# Patient Record
Sex: Male | Born: 1985 | Race: White | Hispanic: No | Marital: Married | Smoking: Never smoker
Health system: Southern US, Community
[De-identification: ages and names within clinical notes are randomized; demographics above are authoritative.]

---

## 2006-12-01 ENCOUNTER — Emergency Department: Payer: Self-pay | Admitting: Emergency Medicine

## 2007-07-08 ENCOUNTER — Emergency Department: Payer: Self-pay | Admitting: Unknown Physician Specialty

## 2009-01-17 ENCOUNTER — Emergency Department (HOSPITAL_COMMUNITY): Admission: EM | Admit: 2009-01-17 | Discharge: 2009-01-18 | Payer: Self-pay | Admitting: Emergency Medicine

## 2009-02-04 ENCOUNTER — Ambulatory Visit (HOSPITAL_BASED_OUTPATIENT_CLINIC_OR_DEPARTMENT_OTHER): Admission: RE | Admit: 2009-02-04 | Discharge: 2009-02-04 | Payer: Self-pay | Admitting: Orthopedic Surgery

## 2010-10-17 LAB — POCT HEMOGLOBIN-HEMACUE
Hemoglobin: 13.6 g/dL (ref 13.0–17.0)
Hemoglobin: 17 g/dL (ref 13.0–17.0)

## 2010-11-23 NOTE — Consult Note (Signed)
Cole Lopez, Cole Lopez            ACCOUNT NO.:  [id number]   MEDICAL RECORD NO.:  [id number]          PATIENT TYPE:  EMS   LOCATION:  ED                           FACILITY:  [facility]   PHYSICIAN:  Artist Pais. Weingold, M.D.DATE OF BIRTH:  [birth year]   DATE OF CONSULTATION:  01/17/2009  DATE OF DISCHARGE:  01/18/2009                                 CONSULTATION   REQUESTING PHYSICIAN:  Bethann Berkshire, MD   REASON FOR CONSULTATION:  Cole Lopez is a 25 year old right hand  dominant male, works at __________when unfortunately he suffered an  injury to his left hand involving the small and ring fingers.  He is 25  years old.   ALLERGIES:  No known drug allergies.   MEDICATIONS:  Non current medications.   PAST HISTORY:  No recent hospitalizations or surgery.   FAMILY HISTORY:  Noncontributory.   SOCIAL HISTORY:  Noncontributory.   PHYSICAL EXAMINATION:  GENERAL:  A well-developed, well-nourished male,  pleasant, alert and oriented x3.  EXTREMITIES:  Examination of his hand, he has a small laceration over  the small finger volarly over the distal interphalangeal joint, full  tendon function, neurosensory is intact.  Examination of his ring finger  reveals a soft tissue loss with exposed distal phalangeal bone volarly  and oblique angle, tendon function is intact.  The nail plate and nail  bed are intact, however, again there is oblique soft tissue loss with  exposed distal phalangeal bone.   X-rays are pending.   IMPRESSION:  A 26 year old male with what appears to be injuries to the  ring and small finger on his nondominant left hand status post accident  at __________.  He clearly has exposed distal phalangeal bone  nondominant left ring finger which will require 1 of 3 options.  We  discussed in great detail full thickness skin grafting which will leave  him insensate palmarly which is unsatisfactory.  He could also undergo a  skeletal shortening of the DIP level, however,  his nail plate is intact  and his tendon function is intact therefore this is also not a great  option.  The most reasonable option in a young healthy person otherwise  would be a cross finger flap from the long finger to the ring finger  which is going to give him some neurosensory recovery.  At this point in  time we  had a thorough discussion.  He last ate a meal 3 hours ago, a full meal.  Therefore, we will go ahead and soak him in Betadine, close his wound on  the small finger and dress his ring finger.  He is to be given  antibiotics and call my office Monday morning.  We will set him up  electively for a left long to left ring finger cross finger flap.      Artist Pais Mina Marble, M.D.  Electronically Signed     MAW/MEDQ  D:  01/17/2009  T:  01/18/2009  Job:  [id number]

## 2010-11-23 NOTE — Op Note (Signed)
Cole Lopez, Cole Lopez            ACCOUNT NO.:  [id number]   MEDICAL RECORD NO.:  [id number]          PATIENT TYPE:  AMB   LOCATION:  DSC                          FACILITY:  [facility]   PHYSICIAN:  Artist Pais. 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   PROCEDURE:  Division of left long to left ring cross finger flap with  manipulation of left long and left ring finger proximal phalangeal  joint.   SURGEON:  Artist Pais. Mina Marble, MD   ASSISTANT:  None.   ANESTHESIA:  General.   TOURNIQUET:  None.   COMPLICATIONS:  None.   DRAINS:  None.   OPERATIVE REPORT:  The patient was taken to the operative suite.  After  induction of adequate general anesthesia, the left upper extremity was  prepped and draped in sterile fashion.  Once this was done, the cross  finger flap in the left long to left ring finger was divided.  The flap  was inset onto both the ring and long fingers using 4-0 Vicryl Rapide  suture.  Once this was done, gentle manipulation was undertaken of the  proximal phalangeal joint to the long ring finger until full motion was  obtained.  The wounds were then lightly irrigated and dressed with  Xeroform, 4 x 4s, and Coban wrap.  The patient tolerated the procedure  well and went to the recovery room in stable fashion.      Artist Pais Mina Marble, M.D.  Electronically Signed     MAW/MEDQ  D:  02/04/2009  T:  02/04/2009  Job:  [id number]

## 2019-08-13 ENCOUNTER — Other Ambulatory Visit: Payer: Self-pay

## 2019-08-14 ENCOUNTER — Ambulatory Visit: Payer: Commercial Managed Care - PPO | Attending: Internal Medicine

## 2019-08-14 DIAGNOSIS — Z20822 Contact with and (suspected) exposure to covid-19: Secondary | ICD-10-CM | POA: Insufficient documentation

## 2019-08-16 LAB — NOVEL CORONAVIRUS, NAA: SARS-CoV-2, NAA: NOT DETECTED

## 2021-02-24 ENCOUNTER — Ambulatory Visit: Payer: Self-pay | Admitting: Internal Medicine

## 2021-03-04 ENCOUNTER — Other Ambulatory Visit (HOSPITAL_BASED_OUTPATIENT_CLINIC_OR_DEPARTMENT_OTHER): Payer: Self-pay | Admitting: Gastroenterology

## 2021-03-04 ENCOUNTER — Other Ambulatory Visit: Payer: Self-pay | Admitting: Gastroenterology

## 2021-03-04 DIAGNOSIS — R748 Abnormal levels of other serum enzymes: Secondary | ICD-10-CM

## 2021-03-04 DIAGNOSIS — R1013 Epigastric pain: Secondary | ICD-10-CM

## 2021-03-04 DIAGNOSIS — R1010 Upper abdominal pain, unspecified: Secondary | ICD-10-CM

## 2021-03-05 ENCOUNTER — Ambulatory Visit
Admission: RE | Admit: 2021-03-05 | Discharge: 2021-03-05 | Disposition: A | Discharge status: Home / Self Care | Payer: Commercial Managed Care - PPO | Source: Ambulatory Visit | Attending: Gastroenterology | Admitting: Gastroenterology

## 2021-03-05 DIAGNOSIS — R1010 Upper abdominal pain, unspecified: Secondary | ICD-10-CM

## 2021-03-05 DIAGNOSIS — R748 Abnormal levels of other serum enzymes: Secondary | ICD-10-CM

## 2021-03-05 DIAGNOSIS — R1013 Epigastric pain: Secondary | ICD-10-CM

## 2021-03-08 ENCOUNTER — Ambulatory Visit
Admission: RE | Admit: 2021-03-08 | Discharge: 2021-03-08 | Disposition: A | Discharge status: Home / Self Care | Payer: Commercial Managed Care - PPO | Source: Ambulatory Visit | Attending: Gastroenterology | Admitting: Gastroenterology

## 2021-03-08 ENCOUNTER — Other Ambulatory Visit: Payer: Self-pay

## 2021-03-08 DIAGNOSIS — R748 Abnormal levels of other serum enzymes: Secondary | ICD-10-CM | POA: Diagnosis not present

## 2021-03-08 DIAGNOSIS — R1013 Epigastric pain: Secondary | ICD-10-CM | POA: Diagnosis present

## 2021-03-08 DIAGNOSIS — R1010 Upper abdominal pain, unspecified: Secondary | ICD-10-CM | POA: Diagnosis present

## 2021-04-06 ENCOUNTER — Other Ambulatory Visit: Payer: Self-pay

## 2021-04-06 ENCOUNTER — Ambulatory Visit: Payer: Commercial Managed Care - PPO | Admitting: Internal Medicine

## 2021-04-06 ENCOUNTER — Encounter: Payer: Self-pay | Admitting: Internal Medicine

## 2021-04-06 VITALS — BP 137/99 | HR 71 | Temp 98.6°F | Ht 68.82 in | Wt 237.6 lb

## 2021-04-06 DIAGNOSIS — F1010 Alcohol abuse, uncomplicated: Secondary | ICD-10-CM

## 2021-04-06 DIAGNOSIS — M549 Dorsalgia, unspecified: Secondary | ICD-10-CM | POA: Diagnosis not present

## 2021-04-06 DIAGNOSIS — R030 Elevated blood-pressure reading, without diagnosis of hypertension: Secondary | ICD-10-CM | POA: Diagnosis not present

## 2021-04-06 LAB — URINALYSIS, ROUTINE W REFLEX MICROSCOPIC
Bilirubin, UA: NEGATIVE
Glucose, UA: NEGATIVE
Ketones, UA: NEGATIVE
Leukocytes,UA: NEGATIVE
Nitrite, UA: NEGATIVE
Protein,UA: NEGATIVE
RBC, UA: NEGATIVE
Specific Gravity, UA: 1.025 (ref 1.005–1.030)
Urobilinogen, Ur: 0.2 mg/dL (ref 0.2–1.0)
pH, UA: 5 (ref 5.0–7.5)

## 2021-04-06 NOTE — Progress Notes (Signed)
BP (!) 137/99   Pulse 71   Temp 98.6 F (37 C) (Oral)   Ht 5' 8.82" (1.748 m)   Wt 237 lb 9.6 oz (107.8 kg)   SpO2 100%   BMI 35.27 kg/m    Subjective:    Patient ID: Cole Lopez, male    DOB: February 20, 1986, 35 y.o.   MRN: [id number]  Chief Complaint  Patient presents with   New Patient (Initial Visit)    HPI: Cole Lopez is a 35 y.o. male  Pt is here for establishing care. Symptoms started on the 17 th of sept had  Had a gall bladder infection per him. Per Gi notes from [facility], he was seen for N/ V x 2 months , had ETOH abuse elaborated belwo, was doung to have intermittent lef numbness, he was seen in ? UC for His symptoms and then referred to GI he was placed on Cipro and flagyl for the ablive emperically, his [place] abd showed possible helatpmaegaly , he was placed on protonix and is schedued for an EGD x 20th oct. He bw which showed an Elevated GERD ? UTI was noted with positive nitirites US abdomen shows prominent hepatomegaly   ETOH abuse used to drink a 5th of liquor x 2 yrs, has bene drinking beer since hew was 16 , a case or two - 24 cans in a week.  Works as an Dipl[employer] Services operational officer in [employer]  Patient presents with   New Patient (Initial Visit)    Relevant past medical, surgical, family and social history reviewed and updated as indicated. Interim medical history since our last visit reviewed. Allergies and medications reviewed and updated.  Review of Systems  Constitutional:  Negative for activity change, appetite change, chills, fatigue and fever.  HENT:  Negative for congestion, ear discharge, ear pain and facial swelling.   Eyes:  Negative for pain, discharge and itching.  Respiratory:  Negative for cough, chest tightness, shortness of breath and wheezing.   Cardiovascular:  Negative for chest pain, palpitations and leg swelling.  Gastrointestinal:  Negative for abdominal distention,  abdominal pain, blood in stool, constipation, diarrhea, nausea and vomiting.  Endocrine: Negative for cold intolerance, heat intolerance, polydipsia, polyphagia and polyuria.  Genitourinary:  Negative for difficulty urinating, dysuria, flank pain, frequency, hematuria and urgency.  Musculoskeletal:  Negative for arthralgias, gait problem, joint swelling and myalgias.  Skin:  Negative for color change, rash and wound.  Neurological:  Negative for dizziness, tremors, speech difficulty, weakness, light-headedness, numbness and headaches.  Hematological:  Does not bruise/bleed easily.  Psychiatric/Behavioral:  Negative for sleep disturbance and suicidal ideas.    Per HPI unless specifically indicated above     Objective:    BP (!) 137/99   Pulse 71   Temp 98.6 F (37 C) (Oral)   Ht 5' 8.82" (1.748 m)   Wt 237 lb 9.6 oz (107.8 kg)   SpO2 100%   BMI 35.27 kg/m   Wt Readings from Last 3 Encounters:  04/06/21 237 lb 9.6 oz (107.8 kg)    Physical Exam Vitals and nursing note reviewed.  Constitutional:      General: He is not in acute distress.    Appearance: Normal appearance. He is not ill-appearing or diaphoretic.  HENT:     Head: Normocephalic and atraumatic.     Right Ear: Tympanic membrane and external ear normal. There is no impacted cerumen.     Left  Ear: External ear normal.     Nose: No congestion or rhinorrhea.     Mouth/Throat:     Pharynx: No oropharyngeal exudate or posterior oropharyngeal erythema.  Eyes:     Conjunctiva/sclera: Conjunctivae normal.     Pupils: Pupils are equal, round, and reactive to light.  Cardiovascular:     Rate and Rhythm: Normal rate and regular rhythm.     Heart sounds: No murmur heard.   No friction rub. No gallop.  Pulmonary:     Effort: No respiratory distress.     Breath sounds: No stridor. No wheezing or rhonchi.  Chest:     Chest wall: No tenderness.  Abdominal:     General: Abdomen is flat. Bowel sounds are normal.      Palpations: Abdomen is soft. There is no mass.     Tenderness: There is no abdominal tenderness.  Musculoskeletal:        General: Normal range of motion.     Cervical back: Normal range of motion and neck supple. No rigidity or tenderness.     Left lower leg: No edema.  Skin:    General: Skin is warm and dry.  Neurological:     General: No focal deficit present.     Mental Status: He is alert.     Cranial Nerves: No cranial nerve deficit.     Sensory: No sensory deficit.     Motor: No weakness.     Coordination: Coordination normal.    Results for orders placed or performed in visit on 08/14/19  Novel Coronavirus, NAA ([facility])   Specimen: Nasopharyngeal(NP) swabs in vial transport medium   NASOPHARYNGE  TESTING  Result Value Ref Range   SARS-CoV-2, NAA Not Detected Not Detected       No current outpatient medications on file.  IMPRESSION: 1. Diffusely echogenic liver consistent with hepatic steatosis with probable fat sparing near the gallbladder fossa. 2. The spleen appears slightly enlarged.    Assessment & Plan:  Abdominal pain / N/ V : will refer to [facility] for help with alchol abuse.  ? Sec to ETOH abuse  To fu with GI for EGD as scheduled.  2. ETOH abuse : used to drink a 5th of liquor x 2 yrs, has been drinking beer since hew was 16 , a case or two - 24 cans in a week.   3. GERD : is on protonix for such GERD continue pantoprazole 40 mg q.day as prescribed patient advised to avoid laying down soon after his meals. He took a 2 hours between dinner and bedtime. Avoid spicy food and triggers that he knows food wise that worsen his acid reflux. Patient verbalized understanding of the above. Lifestyle modifications as above discussed with patient.  4. Prehypertension: Continue current meds.  Medication compliance emphasised. pt advised to keep Bp logs. Pt verbalised understanding of the same. Pt to have a low salt diet . Exercise to reach a goal of at least 150 mins a  week.  lifestyle modifications explained and pt understands importance of the above.    Problem List Items Addressed This Visit   None    Orders Placed This Encounter  Procedures   Lipid panel   CBC with Differential/Platelet   Comprehensive metabolic panel   Urinalysis, Routine w reflex microscopic   AMB Referral to Community Care Coordinaton   Ambulatory referral to Orthopedics     No orders of the defined types were placed in this encounter.    Follow  up plan: No follow-ups on file.

## 2021-04-07 DIAGNOSIS — R030 Elevated blood-pressure reading, without diagnosis of hypertension: Secondary | ICD-10-CM | POA: Insufficient documentation

## 2021-04-07 DIAGNOSIS — M549 Dorsalgia, unspecified: Secondary | ICD-10-CM | POA: Insufficient documentation

## 2021-04-07 DIAGNOSIS — F1010 Alcohol abuse, uncomplicated: Secondary | ICD-10-CM | POA: Insufficient documentation

## 2021-04-07 LAB — LIPID PANEL
Chol/HDL Ratio: 8.3 ratio — ABNORMAL HIGH (ref 0.0–5.0)
Cholesterol, Total: 250 mg/dL — ABNORMAL HIGH (ref 100–199)
HDL: 30 mg/dL — ABNORMAL LOW (ref >39)
LDL Chol Calc (NIH): 195 mg/dL — ABNORMAL HIGH (ref 0–99)
Triglycerides: 134 mg/dL (ref 0–149)
VLDL Cholesterol Cal: 25 mg/dL (ref 5–40)

## 2021-04-07 LAB — COMPREHENSIVE METABOLIC PANEL
ALT: 19 IU/L (ref 0–44)
AST: 19 IU/L (ref 0–40)
Albumin/Globulin Ratio: 1.2 (ref 1.2–2.2)
Albumin: 4.5 g/dL (ref 4.0–5.0)
Alkaline Phosphatase: 67 IU/L (ref 44–121)
BUN/Creatinine Ratio: 14 (ref 9–20)
BUN: 11 mg/dL (ref 6–20)
Bilirubin Total: 0.7 mg/dL (ref 0.0–1.2)
CO2: 22 mmol/L (ref 20–29)
Calcium: 10.1 mg/dL (ref 8.7–10.2)
Chloride: 101 mmol/L (ref 96–106)
Creatinine, Ser: 0.76 mg/dL (ref 0.76–1.27)
Globulin, Total: 3.8 g/dL (ref 1.5–4.5)
Glucose: 93 mg/dL (ref 70–99)
Potassium: 4.6 mmol/L (ref 3.5–5.2)
Sodium: 138 mmol/L (ref 134–144)
Total Protein: 8.3 g/dL (ref 6.0–8.5)
eGFR: 119 mL/min/{1.73_m2} (ref >59)

## 2021-04-07 LAB — CBC WITH DIFFERENTIAL/PLATELET
Basophils Absolute: 0.1 10*3/uL (ref 0.0–0.2)
Basos: 1 %
EOS (ABSOLUTE): 0.2 10*3/uL (ref 0.0–0.4)
Eos: 3 %
Hematocrit: 46.6 % (ref 37.5–51.0)
Hemoglobin: 15.6 g/dL (ref 13.0–17.7)
Immature Grans (Abs): 0 10*3/uL (ref 0.0–0.1)
Immature Granulocytes: 0 %
Lymphocytes Absolute: 2.5 10*3/uL (ref 0.7–3.1)
Lymphs: 26 %
MCH: 32.2 pg (ref 26.6–33.0)
MCHC: 33.5 g/dL (ref 31.5–35.7)
MCV: 96 fL (ref 79–97)
Monocytes Absolute: 0.5 10*3/uL (ref 0.1–0.9)
Monocytes: 5 %
Neutrophils Absolute: 6 10*3/uL (ref 1.4–7.0)
Neutrophils: 65 %
Platelets: 357 10*3/uL (ref 150–450)
RBC: 4.85 x10E6/uL (ref 4.14–5.80)
RDW: 12.1 % (ref 11.6–15.4)
WBC: 9.3 10*3/uL (ref 3.4–10.8)

## 2021-04-20 ENCOUNTER — Telehealth: Payer: Self-pay

## 2021-04-20 NOTE — Chronic Care Management (AMB) (Signed)
  Care Management   Note  04/20/2021 Name: Cole Lopez MRN: [id number] DOB: 06-26-1986  Cole Lopez is a 35 y.o. year old male who is a primary care patient of Vigg, Avanti, MD. I reached out to [employer] by phone today in response to a referral sent by Cole Lopez's primary care provider.   Cole Lopez was given information about care management services today including:  Care management services include personalized support from designated clinical staff supervised by his physician, including individualized plan of care and coordination with other care providers 24/7 contact phone numbers for assistance for urgent and routine care needs. The patient may stop care management services at any time by phone call to the office staff.  Patient did not agree to enrollment in care management services and does not wish to consider at this time.  Follow up plan: Patient declines engagement by the care management team. Appropriate care team members and provider have been notified via electronic communication.   Penne Lash, RMA Care Guide, Embedded Care Coordination [facility]  [place], [place] [address] Direct Dial: [phone] [email or web address]: [email or web address]

## 2021-05-07 ENCOUNTER — Ambulatory Visit: Payer: Commercial Managed Care - PPO | Admitting: Internal Medicine

## 2022-04-02 DIAGNOSIS — K760 Fatty (change of) liver, not elsewhere classified: Secondary | ICD-10-CM | POA: Insufficient documentation

## 2022-04-05 ENCOUNTER — Encounter: Payer: Self-pay | Admitting: Nurse Practitioner

## 2022-04-17 DIAGNOSIS — E7800 Pure hypercholesterolemia, unspecified: Secondary | ICD-10-CM | POA: Insufficient documentation

## 2022-04-17 NOTE — Patient Instructions (Signed)
Alcohol Use Disorder Alcohol use disorder is a condition in which drinking disrupts daily life. People with this condition drink too much alcohol and cannot control their drinking. Alcohol use disorder can cause serious problems with physical health. It can affect the brain, heart, and other internal organs. This disorder can raise the risk for certain cancers and cause problems with mental health, such as depression or anxiety. What are the causes? This condition is caused by drinking too much alcohol over time. Some people with this condition drink to cope with or escape from negative life events. Others drink to relieve symptoms of physical pain or symptoms of mental illness. What increases the risk? You are more likely to develop this condition if: You have a family history of alcohol use disorder. Your culture encourages drinking to the point of becoming drunk (intoxication). You had a mood or conduct disorder in childhood. You have been abused. You are an adolescent and you: Have poor performance in school. Have poor supervision or guidance. Act on impulse and like taking risks. What are the signs or symptoms? Symptoms of this condition include: Drinking more than you want to. Trying several times without success to drink less. Spending a lot of time thinking about alcohol, getting alcohol, drinking alcohol, or recovering from drinking alcohol. Continuing to drink even when it is causing serious problems in your daily life. Drinking when it is dangerous to drink, such as before driving a car. Needing more and more alcohol to get the same effect you want (building up tolerance). Having symptoms of withdrawal when you stop drinking. Withdrawal symptoms may include: Trouble sleeping, leading to tiredness (fatigue). Mood swings of depression and anxiety. Physical symptoms, such as a fast heart rate, rapid breathing, high blood pressure (hypertension), fever, cold sweats, or  nausea. Seizures. Severe confusion. Feeling or seeing things that are not there (hallucinations). Shaking movements that you cannot control (tremors). How is this diagnosed? This condition is diagnosed with an assessment. Your health care provider may start by asking three or four questions about your drinking, or they may give you a simple test to take. This helps to get clear information from you. You may also have a physical exam or lab tests. You may be referred to a substance abuse counselor. How is this treated? With education, some people with alcohol use disorder are able to reduce their drinking. Many with this disorder cannot change their drinking behavior on their own and need help with treatment from substance use specialists. Treatments may include: Detoxification. Detoxification involves quitting drinking with supervision and direction of health care providers. Your health care provider may prescribe medicines within the first week to help lessen withdrawal symptoms. Alcohol withdrawal can be dangerous and life-threatening. Detoxification may be provided in a home, community, or primary care setting, or in a hospital or substance use treatment facility. Counseling. This may involve motivational interviewing (MI), family therapy, or cognitive behavioral therapy (CBT). A counselor can address the things you can do to change your drinking behavior and how to maintain the changes. Talk therapy aims to: Identify your positive motivations to change. Identify and avoid the things that trigger your drinking. Help you learn how to plan your behavior change. Develop support systems that can help you sustain the change. Medicines. Medicines can help treat this disorder by: Decreasing cravings. Decreasing the positive feeling you have when you drink. Causing an uncomfortable physical reaction when you drink (aversion therapy). Support groups such as Alcoholics Anonymous (AA). These groups are    led by people who have quit drinking. The groups provide emotional support, advice, and guidance. Some people with this condition benefit from a combination of treatments provided by specialized substance use treatment centers. Follow these instructions at home:  Medicines Take over-the-counter and prescription medicines only as told by your health care provider. Ask before starting any new medicines, herbs, or supplements. General instructions Ask friends and family members to support your choice to stay sober. Avoid places where alcohol is served. Create a plan to deal with tempting situations. Attend support groups regularly. Practice hobbies or activities you enjoy. Do not drink and drive. How is this prevented? Do not drink alcohol if your health care provider tells you not to drink. If you drink alcohol: Limit how much you have to: 0-1 drink a day for women who are not pregnant. 0-2 drinks a day for men. Know how much alcohol is in your drink. In the [place] one drink equals one 12 oz bottle of beer (355 mL), one 5 oz glass of wine (148 mL), or one 1 oz glass of hard liquor (44 mL). If you have a mental health condition, seek treatment. Develop a healthy lifestyle through: Meditation or deep breathing. Exercise. Spending time in nature. Listening to music. Talking with a trusted friend or family member. If you are a teen: Do not drink alcohol. Avoid gatherings where you might be tempted to drink alcohol. Do not be afraid to say no if someone offers you alcohol. Speak up about why you do not want to drink. Set a positive example for others around you by not drinking. Build relationships with friends who do not drink. Where to find more information Substance Abuse and Mental Health Services Administration: samhsa.gov Alcoholics Anonymous: aa.org Contact a health care provider if: You cannot take your medicines as told. Your symptoms get worse or you experience symptoms of  withdrawal when you stop drinking. You start drinking again (relapse) and your symptoms get worse. Get help right away if: You have thoughts about hurting yourself or others. Get help right away if you feel like you may hurt yourself or others, or have thoughts about taking your own life. Go to your nearest emergency room or: Call 911. Call the National Suicide Prevention Lifeline at 1-800-273-8255 or 988. This is open 24 hours a day. Text the Crisis Text Line at 741741. Summary Alcohol use disorder is a condition in which drinking disrupts daily life. People with this condition drink too much alcohol and cannot control their drinking. Treatment may include detoxification, counseling, medicines, and support groups. Ask friends and family members to support you. Avoid situations where alcohol is served. Get help right away if you have thoughts about hurting yourself or others. This information is not intended to replace advice given to you by your health care provider. Make sure you discuss any questions you have with your health care provider. Document Revised: 09/01/2021 Document Reviewed: 09/01/2021 Elsevier Patient Education  2023 [employer] Inc.  

## 2022-04-20 ENCOUNTER — Ambulatory Visit (INDEPENDENT_AMBULATORY_CARE_PROVIDER_SITE_OTHER): Payer: 59 | Admitting: Nurse Practitioner

## 2022-04-20 ENCOUNTER — Encounter: Payer: Self-pay | Admitting: Internal Medicine

## 2022-04-20 ENCOUNTER — Encounter: Payer: Self-pay | Admitting: Nurse Practitioner

## 2022-04-20 VITALS — BP 136/74 | HR 82 | Temp 98.2°F | Resp 18 | Ht 69.0 in | Wt 257.6 lb

## 2022-04-20 DIAGNOSIS — R030 Elevated blood-pressure reading, without diagnosis of hypertension: Secondary | ICD-10-CM

## 2022-04-20 DIAGNOSIS — J309 Allergic rhinitis, unspecified: Secondary | ICD-10-CM | POA: Insufficient documentation

## 2022-04-20 DIAGNOSIS — L309 Dermatitis, unspecified: Secondary | ICD-10-CM | POA: Insufficient documentation

## 2022-04-20 DIAGNOSIS — Z0000 Encounter for general adult medical examination without abnormal findings: Secondary | ICD-10-CM

## 2022-04-20 DIAGNOSIS — K760 Fatty (change of) liver, not elsewhere classified: Secondary | ICD-10-CM | POA: Diagnosis not present

## 2022-04-20 DIAGNOSIS — Z114 Encounter for screening for human immunodeficiency virus [HIV]: Secondary | ICD-10-CM | POA: Diagnosis not present

## 2022-04-20 DIAGNOSIS — F1010 Alcohol abuse, uncomplicated: Secondary | ICD-10-CM

## 2022-04-20 DIAGNOSIS — Z1159 Encounter for screening for other viral diseases: Secondary | ICD-10-CM

## 2022-04-20 DIAGNOSIS — E7800 Pure hypercholesterolemia, unspecified: Secondary | ICD-10-CM

## 2022-04-20 DIAGNOSIS — J301 Allergic rhinitis due to pollen: Secondary | ICD-10-CM

## 2022-04-20 DIAGNOSIS — L2082 Flexural eczema: Secondary | ICD-10-CM

## 2022-04-20 MED ORDER — FLUTICASONE PROPIONATE 50 MCG/ACT NA SUSP: 2.0000 | Freq: Every day | NASAL | 6 refills | Status: DC

## 2022-04-20 MED ORDER — TRIAMCINOLONE ACETONIDE 0.1 % EX CREA: 1.0000 | TOPICAL_CREAM | Freq: Two times a day (BID) | CUTANEOUS | 5 refills | Status: DC

## 2022-04-20 NOTE — Assessment & Plan Note (Signed)
Noted on imaging 03/08/21 -- at this time recommend cut back on alcohol use and work towards complete cessation.  Focus on healthy diet changes and regular exercise, 30 minutes 5 days a week.  Check labs today.  Consider GI referral in future if and worsening LFTs.

## 2022-04-20 NOTE — Progress Notes (Signed)
BP 136/74 (BP Location: Left Arm, Patient Position: Sitting, Cuff Size: Normal)   Pulse 82   Temp 98.2 F (36.8 C) (Oral)   Resp 18   Ht 5' 9" (1.753 m)   Wt 257 lb 9.6 oz (116.8 kg)   SpO2 99%   BMI 38.04 kg/m    Subjective:    Patient ID: Cole Lopez, male    DOB: 02/11/1986, 36 y.o.   MRN: [id number]  HPI: Cole Lopez is a 36 y.o. male presenting on 04/20/2022 for comprehensive medical examination. Current medical complaints include: allergies and dry skin  He currently lives with: wife Interim Problems from his last visit:  as above  History of elevation in LFTs on past labs with imaging done noting hepatic steatosis and spleen slightly enlarged.  He endorses alcohol use -- currently between his wife and him drink about a 5th of brown liquor, this is a cut back from previous -- used to drink 1/2 gallon between his wife and him. [place] Office Visit from 04/20/2022 in [facility]  Alcohol Use Disorder Identification Test Final Score (AUDIT) 8       SKIN DRYNESS Present to elbow and knees -- has been present for awhile.  More to left elbow. Duration: months Location: as above History of trauma in area: no Pain: no Redness: no Swelling: no Oozing: no Pus: no Fevers: no Nausea/vomiting: no Status: fluctuating Treatments attempted: various lotions   ALLERGIES Has been struggling with allergies for a few months.  Taking Sudafed. Works in [place]. Duration: months Runny nose: yes "clear Nasal congestion: yes Nasal itching: yes Sneezing: yes Eye swelling, itching or discharge: no Post nasal drip: yes Cough: yes Sinus pressure: no  Ear pain: none Ear pressure: yes bilateral Fever: none Symptoms occur seasonally: yes Symptoms occur perenially: no Satisfied with current treatment: no Allergist evaluation in past: no Allergen injection immunotherapy: no Recurrent sinus infections:  no ENT evaluation in past: no Known environmental allergy: no Indoor pets: yes History of asthma: no Current allergy medications: Sudafed Treatments attempted: Sudafed, Xyzal   The ASCVD Risk score (Arnett DK, et al., 2019) failed to calculate for the following reasons:   The 2019 ASCVD risk score is only valid for ages 62 to 41  Functional Status Survey: Is the patient deaf or have difficulty hearing?: No Does the patient have difficulty seeing, even when wearing glasses/contacts?: No Does the patient have difficulty concentrating, remembering, or making decisions?: No Does the patient have difficulty walking or climbing stairs?: No Does the patient have difficulty dressing or bathing?: No Does the patient have difficulty doing errands alone such as visiting a doctor's office or shopping?: No  FALL RISK:    04/20/2022    2:28 PM 04/06/2021    9:04 AM  [place] in the past year? 0 0  Number falls in past yr: 0 0  Injury with Fall? 0 0  Risk for fall due to : No Fall Risks No Fall Risks  Follow up Falls evaluation completed Falls evaluation completed       04/20/2022    2:28 PM 04/06/2021    9:04 AM  Depression screen PHQ 2/9  Decreased Interest 0 0  Down, Depressed, Hopeless 0 0  PHQ - 2 Score 0 0  Altered sleeping 2 0  Tired, decreased energy 1 0  Change in appetite 1 0  Feeling bad or failure about yourself  0 0  Trouble concentrating 0 0  Moving slowly or fidgety/restless 0 0  Suicidal thoughts 0 0  PHQ-9 Score 4 0  Difficult doing work/chores Somewhat difficult Not difficult at all       04/20/2022    2:28 PM  GAD 7 : Generalized Anxiety Score  Nervous, Anxious, on Edge 0  Control/stop worrying 0  Worry too much - different things 0  Trouble relaxing 0  Restless 0  Easily annoyed or irritable 0  Afraid - awful might happen 0  Total GAD 7 Score 0  Anxiety Difficulty Not difficult at all    Advanced Directives <no information>  Past Medical  History:  History reviewed. No pertinent past medical history.  Surgical History:  History reviewed. No pertinent surgical history.  Medications:  No current outpatient medications on file prior to visit.   No current facility-administered medications on file prior to visit.    Allergies:  No Known Allergies  Social History:  Social History   Socioeconomic History   Marital status: Married    Spouse name: Not on file   Number of children: Not on file   Years of education: Not on file   Highest education level: Not on file  Occupational History   Not on file  Tobacco Use   Smoking status: Never   Smokeless tobacco: Never  Vaping Use   Vaping Use: Never used  Substance and Sexual Activity   Alcohol use: Not Currently   Drug use: Never   Sexual activity: Not Currently  Other Topics Concern   Not on file  Social History Narrative   Works for [employer]   Social Determinants of Health   Financial Resource Strain: Not on file  Food Insecurity: Not on file  Transportation Needs: Not on file  Physical Activity: Not on file  Stress: Not on file  Social Connections: Not on file  Intimate Partner Violence: Not on file   Social History   Tobacco Use  Smoking Status Never  Smokeless Tobacco Never   Social History   Substance and Sexual Activity  Alcohol Use Not Currently    Family History:  Family History  Problem Relation Age of Onset   Diabetes Mother    Diabetes Father    Heart disease Father     Past medical history, surgical history, medications, allergies, family history and social history reviewed with patient today and changes made to appropriate areas of the chart.   ROS All other ROS negative except what is listed above and in the HPI.      Objective:    BP 136/74 (BP Location: Left Arm, Patient Position: Sitting, Cuff Size: Normal)   Pulse 82   Temp 98.2 F (36.8 C) (Oral)   Resp 18   Ht 5' 9" (1.753 m)   Wt 257 lb 9.6 oz (116.8  kg)   SpO2 99%   BMI 38.04 kg/m   Wt Readings from Last 3 Encounters:  04/20/22 257 lb 9.6 oz (116.8 kg)  04/06/21 237 lb 9.6 oz (107.8 kg)    Physical Exam Vitals and nursing note reviewed.  Constitutional:      General: He is awake. He is not in acute distress.    Appearance: He is well-developed and well-groomed. He is obese. He is not ill-appearing or toxic-appearing.  HENT:     Head: Normocephalic and atraumatic.     Right Ear: Hearing, tympanic membrane, ear canal and external ear normal. No drainage.     Left   Ear: Hearing, tympanic membrane, ear canal and external ear normal. No drainage.     Nose: Rhinorrhea present. Rhinorrhea is clear.     Right Turbinates: Swollen.     Left Turbinates: Swollen.     Right Sinus: No maxillary sinus tenderness or frontal sinus tenderness.     Left Sinus: No maxillary sinus tenderness or frontal sinus tenderness.     Mouth/Throat:     Mouth: Mucous membranes are moist.     Pharynx: Posterior oropharyngeal erythema (mild with cobblestone pattern) present. No pharyngeal swelling or oropharyngeal exudate.     Tonsils: No tonsillar exudate. 2+ on the right. 2+ on the left.  Eyes:     General: Lids are normal.        Right eye: No discharge.        Left eye: No discharge.     Extraocular Movements: Extraocular movements intact.     Conjunctiva/sclera: Conjunctivae normal.     Pupils: Pupils are equal, round, and reactive to light.     Visual Fields: Right eye visual fields normal and left eye visual fields normal.  Neck:     Thyroid: No thyromegaly.     Vascular: No carotid bruit.  Cardiovascular:     Rate and Rhythm: Normal rate and regular rhythm.     Heart sounds: Normal heart sounds, S1 normal and S2 normal. No murmur heard.    No gallop.  Pulmonary:     Effort: Pulmonary effort is normal. No accessory muscle usage or respiratory distress.     Breath sounds: Normal breath sounds.  Abdominal:     General: Bowel sounds are normal.      Palpations: Abdomen is soft. There is no hepatomegaly or splenomegaly.     Tenderness: There is no abdominal tenderness.  Musculoskeletal:        General: Normal range of motion.     Cervical back: Normal range of motion and neck supple.     Right lower leg: No edema.     Left lower leg: No edema.  Lymphadenopathy:     Head:     Right side of head: No submental, submandibular, tonsillar, preauricular or posterior auricular adenopathy.     Left side of head: No submental, submandibular, tonsillar, preauricular or posterior auricular adenopathy.     Cervical: No cervical adenopathy.  Skin:    General: Skin is warm and dry.     Capillary Refill: Capillary refill takes less than 2 seconds.     Findings: Rash present. Rash is scaling.     Comments: Scaling type rash to left elbow and left knee.  Neurological:     Mental Status: He is alert and oriented to person, place, and time.     Gait: Gait is intact.     Deep Tendon Reflexes: Reflexes are normal and symmetric.     Reflex Scores:      Brachioradialis reflexes are 2+ on the right side and 2+ on the left side.      Patellar reflexes are 2+ on the right side and 2+ on the left side. Psychiatric:        Attention and Perception: Attention normal.        Mood and Affect: Mood normal.        Speech: Speech normal.        Behavior: Behavior normal. Behavior is cooperative.        Thought Content: Thought content normal.        Cognition and  Memory: Cognition normal.    Results for orders placed or performed in visit on 04/06/21  Lipid panel  Result Value Ref Range   Cholesterol, Total 250 (H) 100 - 199 mg/dL   Triglycerides 134 0 - 149 mg/dL   HDL 30 (L) >39 mg/dL   VLDL Cholesterol Cal 25 5 - 40 mg/dL   LDL Chol Calc (NIH) 195 (H) 0 - 99 mg/dL   Chol/HDL Ratio 8.3 (H) 0.0 - 5.0 ratio  CBC with Differential/Platelet  Result Value Ref Range   WBC 9.3 3.4 - 10.8 x10E3/uL   RBC 4.85 4.14 - 5.80 x10E6/uL   Hemoglobin 15.6 13.0 -  17.7 g/dL   Hematocrit 46.6 37.5 - 51.0 %   MCV 96 79 - 97 fL   MCH 32.2 26.6 - 33.0 pg   MCHC 33.5 31.5 - 35.7 g/dL   RDW 12.1 11.6 - 15.4 %   Platelets 357 150 - 450 x10E3/uL   Neutrophils 65 Not Estab. %   Lymphs 26 Not Estab. %   Monocytes 5 Not Estab. %   Eos 3 Not Estab. %   Basos 1 Not Estab. %   Neutrophils Absolute 6.0 1.4 - 7.0 x10E3/uL   Lymphocytes Absolute 2.5 0.7 - 3.1 x10E3/uL   Monocytes Absolute 0.5 0.1 - 0.9 x10E3/uL   EOS (ABSOLUTE) 0.2 0.0 - 0.4 x10E3/uL   Basophils Absolute 0.1 0.0 - 0.2 x10E3/uL   Immature Granulocytes 0 Not Estab. %   Immature Grans (Abs) 0.0 0.0 - 0.1 x10E3/uL  Comprehensive metabolic panel  Result Value Ref Range   Glucose 93 70 - 99 mg/dL   BUN 11 6 - 20 mg/dL   Creatinine, Ser 0.76 0.76 - 1.27 mg/dL   eGFR 119 >59 mL/min/1.73   BUN/Creatinine Ratio 14 9 - 20   Sodium 138 134 - 144 mmol/L   Potassium 4.6 3.5 - 5.2 mmol/L   Chloride 101 96 - 106 mmol/L   CO2 22 20 - 29 mmol/L   Calcium 10.1 8.7 - 10.2 mg/dL   Total Protein 8.3 6.0 - 8.5 g/dL   Albumin 4.5 4.0 - 5.0 g/dL   Globulin, Total 3.8 1.5 - 4.5 g/dL   Albumin/Globulin Ratio 1.2 1.2 - 2.2   Bilirubin Total 0.7 0.0 - 1.2 mg/dL   Alkaline Phosphatase 67 44 - 121 IU/L   AST 19 0 - 40 IU/L   ALT 19 0 - 44 IU/L  Urinalysis, Routine w reflex microscopic  Result Value Ref Range   Specific Gravity, UA 1.025 1.005 - 1.030   pH, UA 5.0 5.0 - 7.5   Color, UA Yellow Yellow   Appearance Ur Clear Clear   Leukocytes,UA Negative Negative   Protein,UA Negative Negative/Trace   Glucose, UA Negative Negative   Ketones, UA Negative Negative   RBC, UA Negative Negative   Bilirubin, UA Negative Negative   Urobilinogen, Ur 0.2 0.2 - 1.0 mg/dL   Nitrite, UA Negative Negative      Assessment & Plan:   Problem List Items Addressed This Visit       Respiratory   Allergic rhinitis    Chronic, ongoing.  Recommend stop Sudafed due to elevation in BP and risks.  Start Claritin or Allegra  daily + ordered Flonase nasal spray -- educated him on this and how to use.  Consider allergy testing or ENT in future if ongoing symptoms.        Digestive   Hepatic steatosis    Noted on imaging 03/08/21 --   at this time recommend cut back on alcohol use and work towards complete cessation.  Focus on healthy diet changes and regular exercise, 30 minutes 5 days a week.  Check labs today.  Consider GI referral in future if and worsening LFTs.      Relevant Orders   Comprehensive metabolic panel     Musculoskeletal and Integument   Eczema    To elbows and knees.  Will trial Triamcinolone cream to areas and see if benefit.  If no benefit change to Eucrisa.        Other   Alcohol abuse    Chronic, has been cutting back.  Continues to drink nightly with his wife.  Recommend continue to cut back and work towards complete cessation.  Not interested in medications or rehab.      Relevant Orders   Comprehensive metabolic panel   Elevated low density lipoprotein (LDL) cholesterol level    Noted on past labs with LDL 195.  Recheck today and will consider starting statin if LDL remains above 190 -- discussed with patient -- would need to monitor LFT closely if statin use initiated.      Relevant Orders   Comprehensive metabolic panel   Lipid Panel w/o Chol/HDL Ratio   Prehypertension - Primary    Chronic, ongoing -- suspect related to alcohol use.  ?some underlying OSA based on exam and reported snoring, does not want to have sleep study at this time.  Initial BP elevated today and repeat improved.  Recommend he stop Sudafed and start Claritin or Allegra for allergies + recommend cut back on alcohol use.  Labs today.      Relevant Orders   CBC with Differential/Platelet   TSH   Other Visit Diagnoses     Need for hepatitis C screening test       Hep C screen on labs today per guidelines for one time screening, discussed with patient.   Relevant Orders   Hepatitis C antibody    Encounter for screening for HIV       HIV screen on labs today per guidelines for one time screening, discussed with patient.   Relevant Orders   HIV Antibody (routine testing w rflx)   Encounter for annual physical exam       Annual physical today with labs and health maintenance reviewed, discussed with patient.       IMMUNIZATIONS:   - Tdap: Tetanus vaccination status reviewed: refuses. - Influenza: Refused - Pneumovax: Not applicable - Prevnar: Not applicable - Zostavax vaccine: Not applicable  SCREENING: - Colonoscopy: Not applicable  Discussed with patient purpose of the colonoscopy is to detect colon cancer at curable precancerous or early stages   - AAA Screening: Not applicable  -Hearing Test: Not applicable  -Spirometry: Not applicable   PATIENT COUNSELING:    Sexuality: Discussed sexually transmitted diseases, partner selection, use of condoms, avoidance of unintended pregnancy  and contraceptive alternatives.   Advised to avoid cigarette smoking.  I discussed with the patient that most people either abstain from alcohol or drink within safe limits (<=14/week and <=4 drinks/occasion for males, <=7/weeks and <= 3 drinks/occasion for females) and that the risk for alcohol disorders and other health effects rises proportionally with the number of drinks per week and how often a drinker exceeds daily limits.  Discussed cessation/primary prevention of drug use and availability of treatment for abuse.   Diet: Encouraged to adjust caloric intake to maintain  or achieve ideal body weight, to reduce   intake of dietary saturated fat and total fat, to limit sodium intake by avoiding high sodium foods and not adding table salt, and to maintain adequate dietary potassium and calcium preferably from fresh fruits, vegetables, and low-fat dairy products.    Stressed the importance of regular exercise  Injury prevention: Discussed safety belts, safety helmets, smoke detector, smoking  near bedding or upholstery.   Dental health: Discussed importance of regular tooth brushing, flossing, and dental visits.   Follow up plan: NEXT PREVENTATIVE PHYSICAL DUE IN 1 YEAR. Return in about 6 months (around 10/20/2022) for HTN AND LFT CHECK.  

## 2022-04-20 NOTE — Assessment & Plan Note (Signed)
To elbows and knees.  Will trial Triamcinolone cream to areas and see if benefit.  If no benefit change to [place].

## 2022-04-20 NOTE — Assessment & Plan Note (Signed)
Chronic, ongoing.  Recommend stop Sudafed due to elevation in BP and risks.  Start Claritin or Allegra daily + ordered Flonase nasal spray -- educated him on this and how to use.  Consider allergy testing or ENT in future if ongoing symptoms.

## 2022-04-20 NOTE — Assessment & Plan Note (Signed)
Chronic, has been cutting back.  Continues to drink nightly with his wife.  Recommend continue to cut back and work towards complete cessation.  Not interested in medications or rehab.

## 2022-04-20 NOTE — Assessment & Plan Note (Signed)
Chronic, ongoing -- suspect related to alcohol use.  ?some underlying OSA based on exam and reported snoring, does not want to have sleep study at this time.  Initial BP elevated today and repeat improved.  Recommend he stop Sudafed and start Claritin or Allegra for allergies + recommend cut back on alcohol use.  Labs today.

## 2022-04-20 NOTE — Assessment & Plan Note (Signed)
Noted on past labs with LDL 195.  Recheck today and will consider starting statin if LDL remains above 190 -- discussed with patient -- would need to monitor LFT closely if statin use initiated.

## 2022-04-21 ENCOUNTER — Encounter: Payer: Self-pay | Admitting: Nurse Practitioner

## 2022-04-21 DIAGNOSIS — R7989 Other specified abnormal findings of blood chemistry: Secondary | ICD-10-CM | POA: Insufficient documentation

## 2022-04-21 LAB — COMPREHENSIVE METABOLIC PANEL
ALT: 92 IU/L — ABNORMAL HIGH (ref 0–44)
AST: 109 IU/L — ABNORMAL HIGH (ref 0–40)
Albumin/Globulin Ratio: 1.2 (ref 1.2–2.2)
Albumin: 4 g/dL — ABNORMAL LOW (ref 4.1–5.1)
Alkaline Phosphatase: 71 IU/L (ref 44–121)
BUN/Creatinine Ratio: 5 — ABNORMAL LOW (ref 9–20)
BUN: 4 mg/dL — ABNORMAL LOW (ref 6–20)
Bilirubin Total: 0.6 mg/dL (ref 0.0–1.2)
CO2: 25 mmol/L (ref 20–29)
Calcium: 9.3 mg/dL (ref 8.7–10.2)
Chloride: 101 mmol/L (ref 96–106)
Creatinine, Ser: 0.78 mg/dL (ref 0.76–1.27)
Globulin, Total: 3.3 g/dL (ref 1.5–4.5)
Glucose: 105 mg/dL — ABNORMAL HIGH (ref 70–99)
Potassium: 3.6 mmol/L (ref 3.5–5.2)
Sodium: 143 mmol/L (ref 134–144)
Total Protein: 7.3 g/dL (ref 6.0–8.5)
eGFR: 118 mL/min/{1.73_m2} (ref >59)

## 2022-04-21 LAB — LIPID PANEL W/O CHOL/HDL RATIO
Cholesterol, Total: 201 mg/dL — ABNORMAL HIGH (ref 100–199)
HDL: 27 mg/dL — ABNORMAL LOW (ref >39)
LDL Chol Calc (NIH): 139 mg/dL — ABNORMAL HIGH (ref 0–99)
Triglycerides: 190 mg/dL — ABNORMAL HIGH (ref 0–149)
VLDL Cholesterol Cal: 35 mg/dL (ref 5–40)

## 2022-04-21 LAB — CBC WITH DIFFERENTIAL/PLATELET
Basophils Absolute: 0.1 10*3/uL (ref 0.0–0.2)
Basos: 2 %
EOS (ABSOLUTE): 0.2 10*3/uL (ref 0.0–0.4)
Eos: 2 %
Hematocrit: 46.9 % (ref 37.5–51.0)
Hemoglobin: 16.4 g/dL (ref 13.0–17.7)
Immature Grans (Abs): 0 10*3/uL (ref 0.0–0.1)
Immature Granulocytes: 0 %
Lymphocytes Absolute: 2.2 10*3/uL (ref 0.7–3.1)
Lymphs: 26 %
MCH: 35.5 pg — ABNORMAL HIGH (ref 26.6–33.0)
MCHC: 35 g/dL (ref 31.5–35.7)
MCV: 102 fL — ABNORMAL HIGH (ref 79–97)
Monocytes Absolute: 0.4 10*3/uL (ref 0.1–0.9)
Monocytes: 5 %
Neutrophils Absolute: 5.6 10*3/uL (ref 1.4–7.0)
Neutrophils: 65 %
Platelets: 315 10*3/uL (ref 150–450)
RBC: 4.62 x10E6/uL (ref 4.14–5.80)
RDW: 12.3 % (ref 11.6–15.4)
WBC: 8.6 10*3/uL (ref 3.4–10.8)

## 2022-04-21 LAB — HIV ANTIBODY (ROUTINE TESTING W REFLEX): HIV Screen 4th Generation wRfx: NONREACTIVE

## 2022-04-21 LAB — HEPATITIS C ANTIBODY: Hep C Virus Ab: NONREACTIVE

## 2022-04-21 LAB — TSH: TSH: 1.37 u[IU]/mL (ref 0.450–4.500)

## 2022-04-21 NOTE — Progress Notes (Signed)
Contacted via MyChart    Good evening Cole Lopez, your labs have returned: - Kidney function, creatinine and eGFR, remains normal. Liver function testing is increased, AST and ALT, I highly recommend you cut back on alcohol intake to help improve these levels + avoid Tylenol products.  If ongoing elevations next visit we will want to repeat imaging of liver. - Cholesterol labs are elevated, however not as high as previous visit.  For this we can continue to monitor and I recommend heavy focus on diet changes, decreasing alcohol intake, and exercising a least 5 days a week for 30 minutes. - CBC shows no anemia or infection. - Hep C and HIV are negative.  Thyroid, TSH, is normal.  Any questions? Keep being amazing!!  Thank you for allowing me to participate in your care.  I appreciate you. Kindest regards, Jilliane Kazanjian

## 2022-08-08 ENCOUNTER — Emergency Department: Payer: Self-pay

## 2022-08-08 ENCOUNTER — Encounter: Payer: Self-pay | Admitting: Emergency Medicine

## 2022-08-08 ENCOUNTER — Observation Stay: Payer: Self-pay

## 2022-08-08 ENCOUNTER — Inpatient Hospital Stay
Admission: EM | Admit: 2022-08-08 | Discharge: 2022-08-11 | DRG: 641 | Disposition: A | Discharge status: Home / Self Care | Payer: Self-pay | Source: Ambulatory Visit | Attending: Student | Admitting: Student

## 2022-08-08 ENCOUNTER — Other Ambulatory Visit: Payer: Self-pay

## 2022-08-08 DIAGNOSIS — R262 Difficulty in walking, not elsewhere classified: Secondary | ICD-10-CM | POA: Diagnosis present

## 2022-08-08 DIAGNOSIS — R42 Dizziness and giddiness: Secondary | ICD-10-CM

## 2022-08-08 DIAGNOSIS — R29898 Other symptoms and signs involving the musculoskeletal system: Secondary | ICD-10-CM | POA: Diagnosis present

## 2022-08-08 DIAGNOSIS — E876 Hypokalemia: Secondary | ICD-10-CM

## 2022-08-08 DIAGNOSIS — R200 Anesthesia of skin: Secondary | ICD-10-CM

## 2022-08-08 DIAGNOSIS — R531 Weakness: Secondary | ICD-10-CM

## 2022-08-08 DIAGNOSIS — R197 Diarrhea, unspecified: Secondary | ICD-10-CM | POA: Diagnosis present

## 2022-08-08 DIAGNOSIS — Z6836 Body mass index (BMI) 36.0-36.9, adult: Secondary | ICD-10-CM

## 2022-08-08 DIAGNOSIS — Z833 Family history of diabetes mellitus: Secondary | ICD-10-CM

## 2022-08-08 DIAGNOSIS — F1010 Alcohol abuse, uncomplicated: Secondary | ICD-10-CM | POA: Diagnosis present

## 2022-08-08 DIAGNOSIS — R7989 Other specified abnormal findings of blood chemistry: Secondary | ICD-10-CM | POA: Diagnosis present

## 2022-08-08 DIAGNOSIS — K760 Fatty (change of) liver, not elsewhere classified: Secondary | ICD-10-CM | POA: Diagnosis present

## 2022-08-08 DIAGNOSIS — E785 Hyperlipidemia, unspecified: Secondary | ICD-10-CM | POA: Diagnosis present

## 2022-08-08 DIAGNOSIS — Z8619 Personal history of other infectious and parasitic diseases: Secondary | ICD-10-CM

## 2022-08-08 DIAGNOSIS — R946 Abnormal results of thyroid function studies: Secondary | ICD-10-CM | POA: Diagnosis present

## 2022-08-08 DIAGNOSIS — R9431 Abnormal electrocardiogram [ECG] [EKG]: Secondary | ICD-10-CM | POA: Diagnosis present

## 2022-08-08 DIAGNOSIS — E538 Deficiency of other specified B group vitamins: Secondary | ICD-10-CM | POA: Diagnosis present

## 2022-08-08 DIAGNOSIS — Z8249 Family history of ischemic heart disease and other diseases of the circulatory system: Secondary | ICD-10-CM

## 2022-08-08 DIAGNOSIS — R202 Paresthesia of skin: Secondary | ICD-10-CM

## 2022-08-08 LAB — COMPREHENSIVE METABOLIC PANEL
ALT: 39 U/L (ref 0–44)
AST: 145 U/L — ABNORMAL HIGH (ref 15–41)
Albumin: 3.8 g/dL (ref 3.5–5.0)
Alkaline Phosphatase: 54 U/L (ref 38–126)
Anion gap: 15 (ref 5–15)
BUN: 5 mg/dL — ABNORMAL LOW (ref 6–20)
CO2: 30 mmol/L (ref 22–32)
Calcium: 8.5 mg/dL — ABNORMAL LOW (ref 8.9–10.3)
Chloride: 90 mmol/L — ABNORMAL LOW (ref 98–111)
Creatinine, Ser: 0.82 mg/dL (ref 0.61–1.24)
GFR, Estimated: >60 mL/min (ref >60)
Glucose, Bld: 115 mg/dL — ABNORMAL HIGH (ref 70–99)
Potassium: 2.6 mmol/L — CL (ref 3.5–5.1)
Sodium: 135 mmol/L (ref 135–145)
Total Bilirubin: 2.4 mg/dL — ABNORMAL HIGH (ref 0.3–1.2)
Total Protein: 8 g/dL (ref 6.5–8.1)

## 2022-08-08 LAB — CSF CELL COUNT WITH DIFFERENTIAL
Eosinophils, CSF: 0 %
Lymphs, CSF: 82 %
Monocyte-Macrophage-Spinal Fluid: 18 %
RBC Count, CSF: 7 /mm3 — ABNORMAL HIGH (ref 0–3)
Segmented Neutrophils-CSF: 0 %
Tube #: 3
WBC, CSF: 2 /mm3 (ref 0–5)

## 2022-08-08 LAB — BASIC METABOLIC PANEL
Anion gap: 9 (ref 5–15)
BUN: 5 mg/dL — ABNORMAL LOW (ref 6–20)
CO2: 32 mmol/L (ref 22–32)
Calcium: 7.4 mg/dL — ABNORMAL LOW (ref 8.9–10.3)
Chloride: 95 mmol/L — ABNORMAL LOW (ref 98–111)
Creatinine, Ser: 0.74 mg/dL (ref 0.61–1.24)
GFR, Estimated: >60 mL/min (ref >60)
Glucose, Bld: 95 mg/dL (ref 70–99)
Potassium: 2.5 mmol/L — CL (ref 3.5–5.1)
Sodium: 136 mmol/L (ref 135–145)

## 2022-08-08 LAB — TSH: TSH: 4.636 u[IU]/mL — ABNORMAL HIGH (ref 0.350–4.500)

## 2022-08-08 LAB — CBC
HCT: 37.8 % — ABNORMAL LOW (ref 39.0–52.0)
Hemoglobin: 14 g/dL (ref 13.0–17.0)
MCH: 35.6 pg — ABNORMAL HIGH (ref 26.0–34.0)
MCHC: 37 g/dL — ABNORMAL HIGH (ref 30.0–36.0)
MCV: 96.2 fL (ref 80.0–100.0)
Platelets: 236 10*3/uL (ref 150–400)
RBC: 3.93 MIL/uL — ABNORMAL LOW (ref 4.22–5.81)
RDW: 14.5 % (ref 11.5–15.5)
WBC: 6.4 10*3/uL (ref 4.0–10.5)
nRBC: 0 % (ref 0.0–0.2)

## 2022-08-08 LAB — MAGNESIUM: Magnesium: 1.8 mg/dL (ref 1.7–2.4)

## 2022-08-08 LAB — PROTEIN AND GLUCOSE, CSF
Glucose, CSF: 64 mg/dL (ref 40–70)
Total  Protein, CSF: 35 mg/dL (ref 15–45)

## 2022-08-08 LAB — APTT: aPTT: 20 seconds — ABNORMAL LOW (ref 24–36)

## 2022-08-08 LAB — PROTIME-INR
INR: 1.1 (ref 0.8–1.2)
Prothrombin Time: 14 seconds (ref 11.4–15.2)

## 2022-08-08 MED ORDER — SODIUM CHLORIDE 0.9% FLUSH
3.0000 mL | Freq: Two times a day (BID) | INTRAVENOUS | Status: DC
  Administered 2022-08-08 – 2022-08-11 (×5): 3 mL via INTRAVENOUS

## 2022-08-08 MED ORDER — POLYETHYLENE GLYCOL 3350 17 G PO PACK: 17.0000 g | PACK | Freq: Every day | ORAL | Status: DC | PRN

## 2022-08-08 MED ORDER — GADOBUTROL 1 MMOL/ML IV SOLN
10.0000 mL | Freq: Once | INTRAVENOUS | Status: AC | PRN
  Administered 2022-08-08: 10 mL via INTRAVENOUS

## 2022-08-08 MED ORDER — ONDANSETRON HCL 4 MG/2ML IJ SOLN: 4.0000 mg | Freq: Four times a day (QID) | INTRAMUSCULAR | Status: DC | PRN

## 2022-08-08 MED ORDER — ACETAMINOPHEN 650 MG RE SUPP: 650.0000 mg | Freq: Four times a day (QID) | RECTAL | Status: DC | PRN

## 2022-08-08 MED ORDER — THIAMINE MONONITRATE 100 MG PO TABS
100.0000 mg | ORAL_TABLET | Freq: Every day | ORAL | Status: DC
  Administered 2022-08-08 – 2022-08-11 (×4): 100 mg via ORAL
  Filled 2022-08-08 (×4): qty 1

## 2022-08-08 MED ORDER — FOLIC ACID 1 MG PO TABS
1.0000 mg | ORAL_TABLET | Freq: Every day | ORAL | Status: DC
  Administered 2022-08-08 – 2022-08-11 (×4): 1 mg via ORAL
  Filled 2022-08-08 (×4): qty 1

## 2022-08-08 MED ORDER — POTASSIUM CHLORIDE 10 MEQ/100ML IV SOLN
10.0000 meq | INTRAVENOUS | Status: AC
  Administered 2022-08-09 (×4): 10 meq via INTRAVENOUS
  Filled 2022-08-08 (×4): qty 100

## 2022-08-08 MED ORDER — POTASSIUM CHLORIDE 10 MEQ/100ML IV SOLN
10.0000 meq | Freq: Once | INTRAVENOUS | Status: AC
  Administered 2022-08-08: 10 meq via INTRAVENOUS
  Filled 2022-08-08: qty 100

## 2022-08-08 MED ORDER — ENOXAPARIN SODIUM 60 MG/0.6ML IJ SOSY
0.5000 mg/kg | PREFILLED_SYRINGE | INTRAMUSCULAR | Status: DC
  Administered 2022-08-10 – 2022-08-11 (×2): 57.5 mg via SUBCUTANEOUS
  Filled 2022-08-08 (×3): qty 0.6

## 2022-08-08 MED ORDER — SODIUM CHLORIDE 0.9 % IV BOLUS
1000.0000 mL | Freq: Once | INTRAVENOUS | Status: AC
  Administered 2022-08-08: 1000 mL via INTRAVENOUS

## 2022-08-08 MED ORDER — ACETAMINOPHEN 325 MG PO TABS: 650.0000 mg | ORAL_TABLET | Freq: Four times a day (QID) | ORAL | Status: DC | PRN

## 2022-08-08 MED ORDER — ONDANSETRON HCL 4 MG PO TABS: 4.0000 mg | ORAL_TABLET | Freq: Four times a day (QID) | ORAL | Status: DC | PRN

## 2022-08-08 MED ORDER — POTASSIUM CHLORIDE CRYS ER 20 MEQ PO TBCR
40.0000 meq | EXTENDED_RELEASE_TABLET | Freq: Once | ORAL | Status: AC
  Administered 2022-08-09: 40 meq via ORAL
  Filled 2022-08-08: qty 2

## 2022-08-08 MED ORDER — MAGNESIUM SULFATE IN D5W 1-5 GM/100ML-% IV SOLN
1.0000 g | Freq: Once | INTRAVENOUS | Status: AC
  Administered 2022-08-08: 1 g via INTRAVENOUS
  Filled 2022-08-08: qty 100

## 2022-08-08 MED ORDER — POTASSIUM CHLORIDE CRYS ER 20 MEQ PO TBCR
40.0000 meq | EXTENDED_RELEASE_TABLET | Freq: Once | ORAL | Status: AC
  Administered 2022-08-08: 40 meq via ORAL
  Filled 2022-08-08: qty 2

## 2022-08-08 NOTE — ED Notes (Signed)
Patient to IR Lumbar puncture at this time

## 2022-08-08 NOTE — Assessment & Plan Note (Signed)
Patient presenting with bilateral lower extremity weakness and numbness for the past 48 hours in the setting of recent influenza.  On examination, strength appears intact, however subjective sensation differences are noted.  Differential includes symptomatic hypokalemia versus neurological deficits such as Guillain-Barr.  Neurology was consulted and recommended lumbar puncture.  - Neurology consulted; appreciate their recommendations - CSF cell count, culture, protein and glucose pending - Management of hypokalemia as noted below - Sniffs twice daily - Neurochecks every 4 hours

## 2022-08-08 NOTE — H&P (Signed)
History and Physical    Patient: Cole Lopez TGG:[id number] DOB: 28-May-1986 DOA: 08/08/2022 DOS: the patient was seen and examined on 08/08/2022 PCP: Loura Pardon, MD (Inactive)  Patient coming from: Home  Chief Complaint:  Chief Complaint  Patient presents with   Numbness   HPI: Cole Lopez is a 37 y.o. male with medical history significant of alcohol abuse, hepatic steatosis, hyperlipidemia, who presents to the ED with complaints of leg weakness.   Cole Lopez states that on 08/02/2022, he developed nausea and vomiting that subsequently progressed to nonbloody, nonmelanotic diarrhea, fever, body aches and chills.  Several days later, he developed a productive cough.  He tested positive for influenza during this time.  He denies any chest pain or shortness of breath.  Then on 08/06/2021, he noticed lower extremities numbness and weakness that started in his feet and moved up bilaterally.  He states yesterday was the worst, as he was unable to walk due to severity of weakness that extended into the top of his thighs.  Today, he feels that his strength has improved but is still not back to baseline.  The numbness has persisted as well without any improvement.  ED course: On arrival to the ED, patient was normotensive at 134/98 with heart rate of 88.  He was saturating at 96% on room air.  He was afebrile at 98.5.  Initial workup notable for a potassium of 2.6, bicarb of 30, BUN 5, creatinine 0.82, AST 145, ALT 39, and total bilirubin of 2.4 with GFR over 60.  WBC within normal limits at 6.4 with hemoglobin of 14.0, and INR within normal limits.  CT of the head was obtained that did not show any acute intracranial abnormalities.  Due to concern for Guillain-Barr, patient was sent for LP and neurology consulted.  TRH contacted for admission.  Review of Systems: As mentioned in the history of present illness. All other systems reviewed and are negative.  History reviewed.  No pertinent past medical history.  History reviewed. No pertinent surgical history.  Social History: Patient denies any drug or tobacco use.  He states he drinks 1 pint of liquor a day.  No Known Allergies  Family History  Problem Relation Age of Onset   Diabetes Mother    Diabetes Father    Heart disease Father     Prior to Admission medications   Medication Sig Start Date End Date Taking? Authorizing Provider  fluticasone (FLONASE) 50 MCG/ACT nasal spray Place 2 sprays into both nostrils daily. 04/20/22   Cannady, Corrie Dandy T, NP  triamcinolone cream (KENALOG) 0.1 % Apply 1 Application topically 2 (two) times daily. 04/20/22   Marjie Skiff, NP    Physical Exam: Vitals:   08/08/22 1146 08/08/22 1147 08/08/22 1409 08/08/22 1550  BP: (!) 134/98  126/80 (!) 136/92  Pulse: 88  72 72  Resp: 16  16 16   Temp: 98.5 F (36.9 C)     SpO2: 96%  96% 96%  Weight:  115.2 kg    Height:  5\' 10"  (1.778 m)     Physical Exam Vitals and nursing note reviewed.  Constitutional:      General: He is not in acute distress.    Appearance: He is obese. He is not toxic-appearing.  HENT:     Head: Normocephalic and atraumatic.     Mouth/Throat:     Mouth: Mucous membranes are moist.     Pharynx: Oropharynx is clear.  Eyes:     Extraocular  Movements: Extraocular movements intact.     Conjunctiva/sclera: Conjunctivae normal.     Pupils: Pupils are equal, round, and reactive to light.  Cardiovascular:     Rate and Rhythm: Normal rate and regular rhythm.     Heart sounds: No murmur heard.    No gallop.  Pulmonary:     Effort: Pulmonary effort is normal. No respiratory distress.     Breath sounds: Normal breath sounds. No wheezing, rhonchi or rales.  Abdominal:     General: Bowel sounds are normal. There is no distension.     Palpations: Abdomen is soft.     Tenderness: There is no abdominal tenderness. There is no guarding.  Musculoskeletal:     Cervical back: Neck supple.     Right  lower leg: No edema.     Left lower leg: No edema.  Skin:    General: Skin is warm and dry.     Comments: Erythematous plaques present on bilateral elbows and on left knee with some silver scaling on top.  Neurological:     Mental Status: He is alert.     Comments:  Bilateral upper and lower extremity strength 5/5 both distally and peripherally. Sensation intact throughout to pinpoint, although subjective difference in sensation of bilateral lower extremities compared to bilateral upper extremities Unable to elicit patellar reflexes bilaterally  Psychiatric:        Mood and Affect: Mood normal.        Behavior: Behavior normal.        Thought Content: Thought content normal.        Judgment: Judgment normal.    Data Reviewed: CBC with WBC of 6.4, hemoglobin of 14.0, MCV of 96, and platelets of 236 CMP with potassium of 2.6, bicarb of 30, sodium 135, glucose 115, BUN 5, creatinine 0.82, calcium 8.5, magnesium 1.8, alkaline phosphatase 54, AST 145, ALT 39 and GFR over 60 INR within normal limits at 1.1 PTT slightly decreased at 20 Glucose 150  EKG personally reviewed.  Sinus rhythm with rate of 66.  QTc elevated at 496.  CT head No acute intracranial abnormality.  Mild paranasal sinus disease  Results are pending, will review when available.  Assessment and Plan:  * Lower extremity weakness Patient presenting with bilateral lower extremity weakness and numbness for the past 48 hours in the setting of recent influenza.  On examination, strength appears intact, however subjective sensation differences are noted.  Differential includes symptomatic hypokalemia versus neurological deficits such as Guillain-Barr.  Neurology was consulted and recommended lumbar puncture.  - Neurology consulted; appreciate their recommendations - CSF cell count, culture, protein and glucose pending - Management of hypokalemia as noted below - Sniffs twice daily - Neurochecks every 4  hours  Hypokalemia Likely due to recent illness with nausea/vomiting/diarrhea and history of alcohol abuse.  - Telemetry monitoring given QTc prolongation on EKG - S/p 40 mEq of oral potassium - Currently receiving 10 mEq of IV - Mag sulfate 1 g once - BMP every 8 hours - Zofran as needed for nausea  Alcohol abuse Patient states he has been cutting down and has not had any alcohol in the past week due to illness.  - CIWA without Ativan - Thiamine and folic acid daily  Elevated LFTs Due to ongoing alcohol use.  Stable at this time.  Advance Care Planning:   Code Status: Full Code   Consults: Neurology  Family Communication: No family at bedside.   Severity of Illness: The appropriate patient  status for this patient is OBSERVATION. Observation status is judged to be reasonable and necessary in order to provide the required intensity of service to ensure the patient's safety. The patient's presenting symptoms, physical exam findings, and initial radiographic and laboratory data in the context of their medical condition is felt to place them at decreased risk for further clinical deterioration. Furthermore, it is anticipated that the patient will be medically stable for discharge from the hospital within 2 midnights of admission.   Author: Jose Persia, MD 08/08/2022 5:30 PM  For on call review [email or web address].

## 2022-08-08 NOTE — ED Triage Notes (Signed)
Presents from [facility] with some leg numbness and tingling  Denies any injury  Recently dx'd with the flu

## 2022-08-08 NOTE — Assessment & Plan Note (Signed)
Due to ongoing alcohol use.  Stable at this time.

## 2022-08-08 NOTE — Consult Note (Addendum)
Neurology Consultation Note  Consult Requested by: Dr. Jari Pigg  Reason for Consult: b/l LE numbness  Consult Date: 08/08/22   The history was obtained from the pt.  During history and examination, all items were able to obtain unless otherwise noted.  History of Present Illness:  Cole Lopez is a 37 y.o. Caucasian male with PMH of HLD and alcohol use had flu one week ago and resolved. 2 days ago he started to have b/l ankle and shin area numbness tingling, and it spread to b/l thigh yesterday. He also felt some heaviness on walking. Today symptoms no worsening but also no improvement. He came to ER for evaluation. Head CT negative. 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 Neuro consulted for evaluation. He did found to have K 2.6 in ER, will give supplement.  History reviewed. No pertinent past medical history.  History reviewed. No pertinent surgical history.  Family History  Problem Relation Age of Onset   Diabetes Mother    Diabetes Father    Heart disease Father     Social History:  reports that he has never smoked. He has never used smokeless tobacco. He reports that he does not currently use alcohol. He reports that he does not use drugs.  Allergies: No Known Allergies  No current facility-administered medications on file prior to encounter.   Current Outpatient Medications on File Prior to Encounter  Medication Sig Dispense Refill   benzonatate (TESSALON) 200 MG capsule Take 200 mg by mouth 3 (three) times daily as needed for cough.     fluticasone (FLONASE) 50 MCG/ACT nasal spray Place 2 sprays into both nostrils daily. (Patient taking differently: Place 2 sprays into both nostrils 2 (two) times daily as needed for allergies.) 16 g 6   ondansetron (ZOFRAN-ODT) 4 MG disintegrating tablet Take 4 mg by mouth every 8 (eight) hours as needed for nausea or vomiting.     oseltamivir (TAMIFLU) 75 MG capsule Take 75 mg by  mouth 2 (two) times daily.     triamcinolone cream (KENALOG) 0.1 % Apply 1 Application topically 2 (two) times daily. (Patient not taking: Reported on 08/08/2022) 30 g 5    Review of Systems: A full ROS was attempted today and was able to be performed.  Systems assessed include - Constitutional, Eyes, HENT, Respiratory, Cardiovascular, Gastrointestinal, Genitourinary, Integument/breast, Hematologic/lymphatic, Musculoskeletal, Neurological, Behavioral/Psych, Endocrine, Allergic/Immunologic - with pertinent responses as per HPI.  Physical Examination: Temp:  [98.5 F (36.9 C)] 98.5 F (36.9 C) (01/29 1146) Pulse Rate:  [72-88] 72 (01/29 1550) Resp:  [16] 16 (01/29 1550) BP: (126-136)/(80-98) 136/92 (01/29 1550) SpO2:  [96 %] 96 % (01/29 1550) Weight:  [115.2 kg-116.8 kg] 115.2 kg (01/29 1147)  General - well nourished, well developed, in no apparent distress.    Ophthalmologic - fundi not visualized due to noncooperation.    Cardiovascular - regular rhythm and rate  Mental Status -  Level of arousal and orientation to time, place, and person were intact. Language including expression, naming, repetition, comprehension was assessed and found intact. Attention span and concentration were normal. Recent and remote memory were intact. Fund of Knowledge was assessed and was intact.  Cranial Nerves II - XII - II - Vision intact OU. III, IV, VI - Extraocular movements intact. V - Facial sensation intact bilaterally. VII - Facial movement intact bilaterally. VIII - Hearing & vestibular intact bilaterally. X - Palate elevates symmetrically. XI - Chin turning &  shoulder shrug intact bilaterally. XII - Tongue protrusion intact.  Motor Strength - The patient's strength was normal in all extremities and pronator drift was absent.   Motor Tone & Bulk - Muscle tone was assessed at the neck and appendages and was normal.  Bulk was normal and fasciculations were absent.   Reflexes - The  patient's reflexes were diminished in all extremities including bicept, tricep, brachioradialis, patellar, Achillis.   Sensory - Light touch, temperature/pinprick were assessed and were decreased b/l upper thigh to above ankle bilaterally.  Sensory felt normal at b/l UEs, trunk, and b/l feet.   Coordination - The patient had normal movements in the hands and feet with no ataxia or dysmetria.  Tremor was absent.  Gait and Station - deferred  Data Reviewed: No results found.  Assessment: 37 y.o. male with PMH of HLD and alcohol use had flu one week ago and resolved. Developed b/l LE numbness for the last 2 days, started from above ankle and ascending to b/l thigh, however, sparing b/l feet. Felt heaviness in the legs but exam normal strength. No other involvement, but does have diminished DTR throughout. Pending CSF but symptoms not quite typical for GBS. He works at [employer] and will order L spine MRI but pt denies back pain. K 2.6 in ER, given supplement. Repeat in am. Diminished DTR may associated with hypokalemia but can not explain the leg numbness.    Plan: LP and CSF to evaluate for GBS MRI L spine with and without contrast.  Hypokalemia under supplement, follow up BMP in am Symptoms quite mild now and continue to monitor at this time. PT/OT Will follow  Thank you for this consultation and allowing [place] to participate in the care of this patient.  Rosalin Hawking, MD PhD Stroke Neurology 08/08/2022 7:57 PM

## 2022-08-08 NOTE — Procedures (Signed)
Technically successful fluoro guided LP at L2-L3 level with opening pressure of 18 cm H2O. 8 cc of clear CSF sent to lab for analysis.  No immediate post procedural complication.  Please see imaging section of Epic for full dictation.    Reatha Armour, PA-C 08/08/2022, 4:20 PM

## 2022-08-08 NOTE — ED Provider Notes (Cosign Needed Addendum)
[facility] Provider Note    Event Date/Time   First MD Initiated Contact with Patient 08/08/22 1238     (approximate)   History   Numbness   HPI  Cole Lopez is a 37 y.o. male with history of EtOH abuse, hepatic steatosis, and elevated LFTs presents emergency department stating that he had flu last Wednesday.  Began to feel dizzy and has numbness and tingling in the legs.  His provider at [facility] clinic sent him here to be worked up for GBS.  He states he has numbness in both legs, does have some difficulty walking due to the dizziness.  States the flu symptoms have resolved.  He did lie on a futon for 2 days due to the illness.      Physical Exam   Triage Vital Signs: ED Triage Vitals  Enc Vitals Group     BP 08/08/22 1146 (!) 134/98     Pulse Rate 08/08/22 1146 88     Resp 08/08/22 1146 16     Temp 08/08/22 1146 98.5 F (36.9 C)     Temp src --      SpO2 08/08/22 1146 96 %     Weight 08/08/22 1143 257 lb 8 oz (116.8 kg)     Height 08/08/22 1143 5\' 9"  (1.753 m)     Head Circumference --      Peak Flow --      Pain Score --      Pain Loc --      Pain Edu? --      Excl. in [place]? --     Most recent vital signs: Vitals:   08/08/22 1409 08/08/22 1550  BP: 126/80 (!) 136/92  Pulse: 72 72  Resp: 16 16  Temp:    SpO2: 96% 96%     General: Awake, no distress.   CV:  Good peripheral perfusion. regular rate and  rhythm Resp:  Normal effort. Lungs cta Abd:  No distention.   Other:  Patient is able to stand on his toes without difficulty, does wobble and become dizzy when leaning back on heels, patient has normal sensation in the lower extremities, decreased DTRs at the patella tendon   ED Results / Procedures / Treatments   Labs (all labs ordered are listed, but only abnormal results are displayed) Labs Reviewed  COMPREHENSIVE METABOLIC PANEL - Abnormal; Notable for the following components:      Result Value   Potassium  2.6 (*)    Chloride 90 (*)    Glucose, Bld 115 (*)    BUN 5 (*)    Calcium 8.5 (*)    AST 145 (*)    Total Bilirubin 2.4 (*)    All other components within normal limits  APTT - Abnormal; Notable for the following components:   aPTT 20 (*)    All other components within normal limits  CBC - Abnormal; Notable for the following components:   RBC 3.93 (*)    HCT 37.8 (*)    MCH 35.6 (*)    MCHC 37.0 (*)    All other components within normal limits  CSF CULTURE W GRAM STAIN  PROTIME-INR  MAGNESIUM  CSF CELL COUNT WITH DIFFERENTIAL  PROTEIN AND GLUCOSE, CSF     EKG     RADIOLOGY CT of the head    PROCEDURES:   .Critical Care E&M  Performed by: Versie Starks, PA-C Critical care provider statement:    Critical care  time (minutes):  45   Critical care time was exclusive of:  Separately billable procedures and treating other patients   Critical care was necessary to treat or prevent imminent or life-threatening deterioration of the following conditions:  CNS failure or compromise   Critical care was time spent personally by me on the following activities:  Blood draw for specimens, development of treatment plan with patient or surrogate, evaluation of patient's response to treatment, examination of patient, obtaining history from patient or surrogate, ordering and performing treatments and interventions, ordering and review of laboratory studies and ordering and review of radiographic studies   I assumed direction of critical care for this patient from another provider in my specialty: no     Care discussed with: admitting provider   After initial E/M assessment, critical care services were subsequently performed that were exclusive of separately billable procedures or treatment.      MEDICATIONS ORDERED IN ED: Medications  potassium chloride SA (KLOR-CON M) CR tablet 40 mEq (has no administration in time range)  sodium chloride 0.9 % bolus 1,000 mL (has no  administration in time range)  potassium chloride 10 mEq in 100 mL IVPB (has no administration in time range)     IMPRESSION / MDM / ASSESSMENT AND PLAN / ED COURSE  I reviewed the triage vital signs and the nursing notes.                              Differential diagnosis includes, but is not limited to, Guillain-Barr, CVA, hypokalemia, hyponatremia  Patient's presentation is most consistent with acute presentation with potential threat to life or bodily function.   Since patient had influenza last week and was had progressing weakness/numbness in the lower extremities we will have to work patient up for GBS.  Initially we will start with CT of the head and labs.  Will progress to a lumbar puncture and spinal imaging if needed.  Dr Jori Moll also evaluated the patient and agrees with treatment plan, this will be a shared visit  Clinical Course as of 08/08/22 1620  Mon Aug 08, 2022  1433 CT Head Wo Contrast [SF]    Clinical Course User Index [SF] Versie Starks, PA-C   Labs are concerning for hypokalemia, patient's potassium is 2.6, he does have elevated liver enzymes which has been elevated in the past  Patient was given 40 mEq of K-Dur p.o.  Consult to neurology.  Spoke with Dr. Erlinda Hong, due to the concerns for [name] feels that we should admit for observation to see if he improves.  Have the lumbar puncture performed.  Due to the patient's habitus we did contact fluoroscopy to do the lumbar puncture.  Consult hospitalist for admission, spoke with dr Charleen Kirks, will be admitting, states will come to ER to see after lumbar puncture as patient is currently going for LP   FINAL CLINICAL IMPRESSION(S) / ED DIAGNOSES   Final diagnoses:  Hypokalemia  Bilateral leg numbness  Weakness  Dizziness     Rx / DC Orders   ED Discharge Orders     None        Note:  This document was prepared using Dragon voice recognition software and may include unintentional dictation  errors.    Versie Starks, PA-C 08/08/22 1621    Caryn Section Linden Dolin, PA-C 08/08/22 1842    Nathaniel Man, MD 08/09/22 [phone]

## 2022-08-08 NOTE — ED Triage Notes (Signed)
Pt sts that he has been having bilateral leg numbness. Pt sts that his is in his thighs on the lateral side.

## 2022-08-08 NOTE — Assessment & Plan Note (Signed)
Patient states he has been cutting down and has not had any alcohol in the past week due to illness.  - CIWA without Ativan - Thiamine and folic acid daily

## 2022-08-08 NOTE — Assessment & Plan Note (Signed)
Likely due to recent illness with nausea/vomiting/diarrhea and history of alcohol abuse.  - Telemetry monitoring given QTc prolongation on EKG - S/p 40 mEq of oral potassium - Currently receiving 10 mEq of IV - Mag sulfate 1 g once - BMP every 8 hours - Zofran as needed for nausea

## 2022-08-09 LAB — BASIC METABOLIC PANEL
Anion gap: 7 (ref 5–15)
Anion gap: 9 (ref 5–15)
BUN: 5 mg/dL — ABNORMAL LOW (ref 6–20)
BUN: <5 mg/dL — ABNORMAL LOW (ref 6–20)
CO2: 29 mmol/L (ref 22–32)
CO2: 28 mmol/L (ref 22–32)
Calcium: 6.9 mg/dL — ABNORMAL LOW (ref 8.9–10.3)
Calcium: 7.2 mg/dL — ABNORMAL LOW (ref 8.9–10.3)
Chloride: 102 mmol/L (ref 98–111)
Chloride: 103 mmol/L (ref 98–111)
Creatinine, Ser: 0.68 mg/dL (ref 0.61–1.24)
Creatinine, Ser: 0.62 mg/dL (ref 0.61–1.24)
GFR, Estimated: >60 mL/min (ref >60)
GFR, Estimated: >60 mL/min (ref >60)
Glucose, Bld: 91 mg/dL (ref 70–99)
Glucose, Bld: 123 mg/dL — ABNORMAL HIGH (ref 70–99)
Potassium: 2.6 mmol/L — CL (ref 3.5–5.1)
Potassium: 3 mmol/L — ABNORMAL LOW (ref 3.5–5.1)
Sodium: 138 mmol/L (ref 135–145)
Sodium: 140 mmol/L (ref 135–145)

## 2022-08-09 LAB — CBC
HCT: 33.2 % — ABNORMAL LOW (ref 39.0–52.0)
Hemoglobin: 12.1 g/dL — ABNORMAL LOW (ref 13.0–17.0)
MCH: 35.3 pg — ABNORMAL HIGH (ref 26.0–34.0)
MCHC: 36.4 g/dL — ABNORMAL HIGH (ref 30.0–36.0)
MCV: 96.8 fL (ref 80.0–100.0)
Platelets: 224 10*3/uL (ref 150–400)
RBC: 3.43 MIL/uL — ABNORMAL LOW (ref 4.22–5.81)
RDW: 14.5 % (ref 11.5–15.5)
WBC: 5.4 10*3/uL (ref 4.0–10.5)
nRBC: 0 % (ref 0.0–0.2)

## 2022-08-09 MED ORDER — ACETAMINOPHEN 325 MG PO TABS: 650.0000 mg | ORAL_TABLET | ORAL | Status: DC | PRN

## 2022-08-09 MED ORDER — POTASSIUM CHLORIDE 10 MEQ/100ML IV SOLN
10.0000 meq | INTRAVENOUS | Status: AC
  Administered 2022-08-09 (×4): 10 meq via INTRAVENOUS
  Filled 2022-08-09: qty 100

## 2022-08-09 MED ORDER — POTASSIUM CHLORIDE CRYS ER 20 MEQ PO TBCR
40.0000 meq | EXTENDED_RELEASE_TABLET | ORAL | Status: AC
  Administered 2022-08-09 (×2): 40 meq via ORAL
  Filled 2022-08-09 (×2): qty 2

## 2022-08-09 MED ORDER — POTASSIUM CHLORIDE 10 MEQ/100ML IV SOLN
10.0000 meq | INTRAVENOUS | Status: AC
  Administered 2022-08-09 (×2): 10 meq via INTRAVENOUS
  Filled 2022-08-09 (×2): qty 100

## 2022-08-09 NOTE — Progress Notes (Signed)
CROSS COVER NOTE  NAME: Jamaul Heist MRN: [id number] DOB : 09-Feb-1986 ATTENDING PHYSICIAN: Sharen Hones, MD    Date of Service   08/09/2022   HPI/Events of Note   Notified of critical K--> 2.6  Interventions   Assessment/Plan:  40 mEQ IV K      To reach the provider On-Call:   7AM- 7PM see care teams to locate the attending and reach out to them via [email or web address] Password: [email or web address] 7PM-7AM contact night-coverage If you still have difficulty reaching the appropriate provider, please page the [facility] (Director on Call) for [facility] on amion for assistance  This document was prepared using Systems analyst and may include unintentional dictation errors.  Neomia Glass DNP, MBA, FNP-BC, PMHNP-BC Nurse Practitioner [facility] Pager [phone]

## 2022-08-09 NOTE — Progress Notes (Signed)
STROKE TEAM PROGRESS NOTE   SUBJECTIVE (INTERVAL HISTORY) No family is at the bedside. Pt walked to and back from bathroom normally, no weakness. He stated that his numbness seems improved some, but still has some numbness at b/l anterior thigh and calf region, no weakness, no involvement of UEs or respiratory or cranial nerves. CSF and MRI so far unrevealing. K this am still 2.6 but now 3.0    OBJECTIVE Temp:  [97.8 F (36.6 C)-98 F (36.7 C)] 97.8 F (36.6 C) (01/30 1000) Pulse Rate:  [57-72] 72 (01/30 1000) Resp:  [16-22] 22 (01/30 1000) BP: (123-139)/(76-92) 123/82 (01/30 0700) SpO2:  [95 %-98 %] 98 % (01/30 1000)  No results for input(s): "GLUCAP" in the last 168 hours. Recent Labs  Lab 08/08/22 1306 08/08/22 2254 08/09/22 0340 08/09/22 1106  NA 135 136 138 140  K 2.6* 2.5* 2.6* 3.0*  CL 90* 95* 102 103  CO2 30 32 29 28  GLUCOSE 115* 95 91 123*  BUN 5* 5* 5* <5*  CREATININE 0.82 0.74 0.68 0.62  CALCIUM 8.5* 7.4* 6.9* 7.2*  MG 1.8  --   --   --    Recent Labs  Lab 08/08/22 1306  AST 145*  ALT 39  ALKPHOS 54  BILITOT 2.4*  PROT 8.0  ALBUMIN 3.8   Recent Labs  Lab 08/08/22 1409 08/09/22 0340  WBC 6.4 5.4  HGB 14.0 12.1*  HCT 37.8* 33.2*  MCV 96.2 96.8  PLT 236 224   No results for input(s): "CKTOTAL", "CKMB", "CKMBINDEX", "TROPONINI" in the last 168 hours. Recent Labs    08/08/22 1306  LABPROT 14.0  INR 1.1   No results for input(s): "COLORURINE", "LABSPEC", "PHURINE", "GLUCOSEU", "HGBUR", "BILIRUBINUR", "KETONESUR", "PROTEINUR", "UROBILINOGEN", "NITRITE", "LEUKOCYTESUR" in the last 72 hours.  Invalid input(s): "APPERANCEUR"     Component Value Date/Time   CHOL 201 (H) 04/20/2022 1501   TRIG 190 (H) 04/20/2022 1501   HDL 27 (L) 04/20/2022 1501   CHOLHDL 8.3 (H) 04/06/2021 0946   LDLCALC 139 (H) 04/20/2022 1501   No results found for: "HGBA1C" No results found for: "LABOPIA", "COCAINSCRNUR", "LABBENZ", "AMPHETMU", "THCU", "LABBARB"  No results  for input(s): "ETH" in the last 168 hours.  I have personally reviewed the radiological images below and agree with the radiology interpretations.  MR Lumbar Spine W Wo Contrast  Result Date: 08/08/2022 CLINICAL DATA:  Lumbar radiculopathy EXAM: MRI LUMBAR SPINE WITHOUT AND WITH CONTRAST TECHNIQUE: Multiplanar and multiecho pulse sequences of the lumbar spine were obtained without and with intravenous contrast. CONTRAST:  81mL GADAVIST GADOBUTROL 1 MMOL/ML IV SOLN COMPARISON:  None Available. FINDINGS: Segmentation:  Standard. Alignment:  Physiologic. Vertebrae: No fracture, evidence of discitis, or bone lesion. Schmorl's node and endplate signal changes at superior endplate of L5. Conus medullaris and cauda equina: Conus extends to the L1 level. Conus and cauda equina appear normal. No abnormal contrast enhancement. Paraspinal and other soft tissues: Negative. Disc levels: L1-L2: Normal disc space and facet joints. No spinal canal stenosis. No neural foraminal stenosis. L2-L3: Normal disc space and facet joints. No spinal canal stenosis. No neural foraminal stenosis. L3-L4: Normal disc space and facet joints. No spinal canal stenosis. No neural foraminal stenosis. L4-L5: Small disc bulge. No spinal canal stenosis. No neural foraminal stenosis. L5-S1: Normal disc space and facet joints. No spinal canal stenosis. No neural foraminal stenosis. Visualized sacrum: Normal. IMPRESSION: Mild degenerative disc disease without spinal canal or neural foraminal stenosis. Electronically Signed   By: Ulyses Jarred  M.D.   On: 08/08/2022 22:53   DG FL GUIDED LUMBAR PUNCTURE  Result Date: 08/08/2022 CLINICAL DATA:  Difficulty walking EXAM: DIAGNOSTIC LUMBAR PUNCTURE UNDER FLUOROSCOPIC GUIDANCE COMPARISON:  None Available. FLUOROSCOPY: Radiation Exposure Index (as provided by the fluoroscopic device): 2.70 mGy Kerma PROCEDURE: This procedure was performed by Reatha Armour, PA-C and supervised and interpreted by Dr Emmit Alexanders. Informed consent was obtained from the patient prior to the procedure, including potential complications of headache, allergy, and pain. With the patient prone, the lower back was prepped with Betadine. 1% Lidocaine was used for local anesthesia. Lumbar puncture was performed at the L2-L3 level using a 20 gauge needle with return of clear CSF with an opening pressure of 18 cm water. 8 ml of CSF were obtained for laboratory studies. The patient tolerated the procedure well and there were no apparent complications. IMPRESSION: Technically successful lumbar puncture at L2-L3 level. Opening pressure of 18 cm water, a mL of clear CSF obtained for laboratory study. Patient tolerated procedure well with no apparent complication. Electronically Signed   By: Emmit Alexanders M.D.   On: 08/08/2022 16:48   CT Head Wo Contrast  Result Date: 08/08/2022 CLINICAL DATA:  Headache EXAM: CT HEAD WITHOUT CONTRAST TECHNIQUE: Contiguous axial images were obtained from the base of the skull through the vertex without intravenous contrast. RADIATION DOSE REDUCTION: This exam was performed according to the departmental dose-optimization program which includes automated exposure control, adjustment of the mA and/or kV according to patient size and/or use of iterative reconstruction technique. COMPARISON:  None Available. FINDINGS: Brain: No evidence of acute infarction, hemorrhage, hydrocephalus, extra-axial collection or mass lesion/mass effect. Vascular: No hyperdense vessel or unexpected calcification. Skull: Normal. Negative for fracture or focal lesion. Sinuses/Orbits: No mastoid or middle ear effusion. Impacted cerumen in the bilateral EACs. Mucosal thickening left frontal, bilateral ethmoid, bilateral maxillary sinuses. Other: None. IMPRESSION: 1. No acute intracranial abnormality. 2. Mild paranasal sinus disease. Electronically Signed   By: Marin Roberts M.D.   On: 08/08/2022 13:47     PHYSICAL EXAM  Temp:  [97.8 F  (36.6 C)-98 F (36.7 C)] 97.8 F (36.6 C) (01/30 1000) Pulse Rate:  [57-72] 72 (01/30 1000) Resp:  [16-22] 22 (01/30 1000) BP: (123-139)/(76-92) 123/82 (01/30 0700) SpO2:  [95 %-98 %] 98 % (01/30 1000)  General - Well nourished, well developed, in no apparent distress.  Ophthalmologic - fundi not visualized due to noncooperation.  Cardiovascular - Regular rhythm and rate.  Mental Status -  Level of arousal and orientation to time, place, and person were intact. Language including expression, naming, repetition, comprehension was assessed and found intact. Attention span and concentration were normal. Recent and remote memory were intact. Fund of Knowledge was assessed and was intact.  Cranial Nerves II - XII - II - Visual field intact OU. III, IV, VI - Extraocular movements intact. V - Facial sensation intact bilaterally. VII - Facial movement intact bilaterally. VIII - Hearing & vestibular intact bilaterally. X - Palate elevates symmetrically. XI - Chin turning & shoulder shrug intact bilaterally. XII - Tongue protrusion intact.  Motor Strength - The patient's strength was normal in all extremities and pronator drift was absent.  Bulk was normal and fasciculations were absent.   Motor Tone - Muscle tone was assessed at the neck and appendages and was normal.  Reflexes - The patient's reflexes were diminished in all extremities.  Sensory - Light touch, temperature/pinprick were assessed and were symmetrical except mildly decreased b/l anterior thigh  and b/l calves sensation.    Coordination - The patient had normal movements in the hands and feet with no ataxia or dysmetria.  Tremor was absent.  Gait and Station - deferred.   ASSESSMENT/PLAN Mr. Cole Lopez is a 37 y.o. male with PMH of HLD and alcohol use had flu one week ago and resolved. Developed b/l LE numbness, started from above ankle and ascending to b/l thigh, however, sparing b/l feet. Felt  heaviness in the legs but exam normal strength. No other involvement, but does have diminished DTR throughout.   DDx include: Hypokalemia - K 2.6->2.6->3.0. This can explain the diminished DTRs but hard to explain the b/l leg numbness. Continue K supplement.  ? GBS - pt has no weakness and numbness only involves calves and anterior thigh, getting better now. CSF unrevealing and MRI L spine no conus enhancement.  ? 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 The sensory distribution not typical either.     Recommend: Continue K supplement and normalization Continue monitoring Will let Dr. Quinn Axe take a look in am We will follow up  Hospital day # 0    Rosalin Hawking, MD PhD Stroke Neurology 08/09/2022 1:01 PM    To contact Stroke Continuity provider, please refer to [email or web address] After hours, contact General Neurology

## 2022-08-09 NOTE — Progress Notes (Signed)
OT Cancellation Note  Patient Details Name: Cole Lopez MRN: [id number] DOB: 1985-08-04   Cancelled Treatment:    Reason Eval/Treat Not Completed: Medical issues which prohibited therapy. OT order received and chart reviewed. Pt's K+ is currently 2.6 and contraindicated for therapy evaluations. OT will re-attempt when pt is able to actively participate.   Darleen Crocker, [place], OTR/L , CBIS ascom [phone]  08/09/22, 10:23 AM

## 2022-08-09 NOTE — Progress Notes (Signed)
Pt performed a NIF of greater than -40 on first attempt and a VC of 4L with good effort.

## 2022-08-09 NOTE — Hospital Course (Addendum)
Cole Lopez is a 37 y.o. male with medical history significant of alcohol abuse, hepatic steatosis, hyperlipidemia, who presents to the ED with complaints of leg weakness.  Patient also had influenza about a week ago, started having nausea vomiting diarrhea.  These symptom has resolved. Patient was found to have severe hypokalemia at 2.6.  MRI of the lumbar spine did not show any acute changes, LP showed normal glucose and protein level.

## 2022-08-09 NOTE — ED Notes (Signed)
Patient seen for admission questions

## 2022-08-09 NOTE — Progress Notes (Signed)
PT Cancellation Note  Patient Details Name: Baylin Gamblin MRN: [id number] DOB: [birth year]   Cancelled Treatment:    Reason Eval/Treat Not Completed: Medical issues which prohibited therapy: Pt's most recent K 2.6 falling outside guidelines for participation with PT services.  Will attempt to see pt at a future date/time as medically appropriate.     Linus Salmons PT, DPT 08/09/22, 9:39 AM

## 2022-08-09 NOTE — Progress Notes (Signed)
NIF performed by PT with good effort, NIF > -40 on all 3 attempts. VC of 4L, also with good effort.

## 2022-08-09 NOTE — Progress Notes (Signed)
  Progress Note   Patient: Cole Lopez LSL:[id number] DOB: 1985-10-29 DOA: 08/08/2022     0 DOS: the patient was seen and examined on 08/09/2022   Brief hospital course: Cole Lopez is a 37 y.o. male with medical history significant of alcohol abuse, hepatic steatosis, hyperlipidemia, who presents to the ED with complaints of leg weakness.  Patient also had influenza about a week ago, started having nausea vomiting diarrhea.  These symptom has resolved. Patient was found to have severe hypokalemia at 2.6.  MRI of the lumbar spine did not show any acute changes, LP showed normal glucose and protein level.   Principal Problem:   Lower extremity weakness Active Problems:   Hypokalemia   Alcohol abuse   Elevated LFTs   Assessment and Plan: * Lower extremity weakness Hypokalemia Being seen by neurology, MRI of the lumbar spine as well LP for neck pain.  Significant weakness and might be due to severe hypokalemia. Patient received IV and oral potassium.  Potassium now 3.0.  Continue replete potassium.  Monitor magnesium level in the morning.  Alcohol abuse Off alcohol for 1 week since he got sick.  Low risk for withdrawal.  Advised to quit.   Elevated LFTs Due to ongoing alcohol use.         Subjective:  Patient feels better today with the leg weakness.  No nausea vomiting diarrhea.  Physical Exam: Vitals:   08/09/22 0245 08/09/22 0600 08/09/22 0700 08/09/22 1000  BP: 131/82 124/82 123/82   Pulse: (!) 59 (!) 57 (!) 57 72  Resp: (!) 22 17 19  (!) 22  Temp: 97.8 F (36.6 C) 97.9 F (36.6 C)  97.8 F (36.6 C)  TempSrc: Oral     SpO2: 95% 96% 96% 98%  Weight:      Height:       General exam: Appears calm and comfortable  Respiratory system: Clear to auscultation. Respiratory effort normal. Cardiovascular system: S1 & S2 heard, RRR. No JVD, murmurs, rubs, gallops or clicks. No pedal edema. Gastrointestinal system: Abdomen is nondistended, soft and  nontender. No organomegaly or masses felt. Normal bowel sounds heard. Central nervous system: Alert and oriented. No focal neurological deficits. Extremities: Symmetric 5 x 5 power. Skin: No rashes, lesions or ulcers Psychiatry: Judgement and insight appear normal. Mood & affect appropriate.    Data Reviewed:  MRI results reviewed, lab results reviewed.  Family Communication: None  Disposition: Status is: Inpatient Remains inpatient appropriate because: Severity of disease, IV treatment.     Time spent: 35 minutes  Author: Sharen Hones, MD 08/09/2022 1:39 PM  For on call review [email or web address].

## 2022-08-10 DIAGNOSIS — R202 Paresthesia of skin: Secondary | ICD-10-CM

## 2022-08-10 LAB — CBC
HCT: 34.6 % — ABNORMAL LOW (ref 39.0–52.0)
Hemoglobin: 12.5 g/dL — ABNORMAL LOW (ref 13.0–17.0)
MCH: 35.5 pg — ABNORMAL HIGH (ref 26.0–34.0)
MCHC: 36.1 g/dL — ABNORMAL HIGH (ref 30.0–36.0)
MCV: 98.3 fL (ref 80.0–100.0)
Platelets: 271 10*3/uL (ref 150–400)
RBC: 3.52 MIL/uL — ABNORMAL LOW (ref 4.22–5.81)
RDW: 14.5 % (ref 11.5–15.5)
WBC: 7.1 10*3/uL (ref 4.0–10.5)
nRBC: 0 % (ref 0.0–0.2)

## 2022-08-10 LAB — IRON AND TIBC
Iron: 90 ug/dL (ref 45–182)
Saturation Ratios: 33 % (ref 17.9–39.5)
TIBC: 272 ug/dL (ref 250–450)
UIBC: 182 ug/dL

## 2022-08-10 LAB — MAGNESIUM: Magnesium: 1.7 mg/dL (ref 1.7–2.4)

## 2022-08-10 LAB — BASIC METABOLIC PANEL
Anion gap: 8 (ref 5–15)
BUN: <5 mg/dL — ABNORMAL LOW (ref 6–20)
CO2: 28 mmol/L (ref 22–32)
Calcium: 7.3 mg/dL — ABNORMAL LOW (ref 8.9–10.3)
Chloride: 105 mmol/L (ref 98–111)
Creatinine, Ser: 0.63 mg/dL (ref 0.61–1.24)
GFR, Estimated: >60 mL/min (ref >60)
Glucose, Bld: 102 mg/dL — ABNORMAL HIGH (ref 70–99)
Potassium: 3 mmol/L — ABNORMAL LOW (ref 3.5–5.1)
Sodium: 141 mmol/L (ref 135–145)

## 2022-08-10 LAB — PHOSPHORUS: Phosphorus: 2.6 mg/dL (ref 2.5–4.6)

## 2022-08-10 LAB — T4, FREE: Free T4: 1.46 ng/dL — ABNORMAL HIGH (ref 0.61–1.12)

## 2022-08-10 LAB — FOLATE: Folate: 6.4 ng/mL (ref >5.9)

## 2022-08-10 LAB — VITAMIN B12: Vitamin B-12: 224 pg/mL (ref 180–914)

## 2022-08-10 MED ORDER — POTASSIUM CHLORIDE 10 MEQ/100ML IV SOLN
10.0000 meq | INTRAVENOUS | Status: AC
  Administered 2022-08-10 (×2): 10 meq via INTRAVENOUS
  Filled 2022-08-10 (×2): qty 100

## 2022-08-10 MED ORDER — CYANOCOBALAMIN 1000 MCG/ML IJ SOLN
1000.0000 ug | Freq: Every day | INTRAMUSCULAR | Status: DC
  Administered 2022-08-10 – 2022-08-11 (×2): 1000 ug via INTRAMUSCULAR
  Filled 2022-08-10 (×2): qty 1

## 2022-08-10 MED ORDER — CYANOCOBALAMIN 500 MCG PO TABS: 500.0000 ug | ORAL_TABLET | Freq: Every day | ORAL | Status: DC

## 2022-08-10 MED ORDER — MAGNESIUM SULFATE 2 GM/50ML IV SOLN
2.0000 g | Freq: Once | INTRAVENOUS | Status: AC
  Administered 2022-08-10: 2 g via INTRAVENOUS
  Filled 2022-08-10: qty 50

## 2022-08-10 MED ORDER — POTASSIUM CHLORIDE CRYS ER 20 MEQ PO TBCR
40.0000 meq | EXTENDED_RELEASE_TABLET | ORAL | Status: AC
  Administered 2022-08-10 (×2): 40 meq via ORAL
  Filled 2022-08-10 (×2): qty 2

## 2022-08-10 NOTE — Progress Notes (Signed)
[facility] Progress Note  Patient: Cole Lopez    FKC:[id number]  DOA: 08/08/2022     Date of Service: the patient was seen and examined on 08/10/2022  Chief Complaint  Patient presents with   Numbness   Brief hospital course: Galan Raj Landress is a 37 y.o. male with medical history significant of alcohol abuse, hepatic steatosis, hyperlipidemia, who presents to the ED with complaints of leg weakness.  Patient also had influenza about a week ago, started having nausea vomiting diarrhea.  These symptom has resolved. Patient was found to have severe hypokalemia at 2.6.  MRI of the lumbar spine did not show any acute changes, LP showed normal glucose and protein level.    Assessment and Plan:  Principal Problem:   Lower extremity weakness Active Problems:   Hypokalemia   Alcohol abuse   Elevated LFTs   # Lower extremity weakness most likely secondary to hypokalemia Being seen by neurology, MRI of the lumbar spine and LP negative  Significant weakness might be due to severe hypokalemia. Weakness is gradually improving Continue fall precautions, ambulate with assistance.     # Hypokalemia secondary to diarrhea Diarrhea is improving Potassium 3.0, repleted. Mag 1.7 at borderline, repleted.   # Alcohol abuse Off alcohol for 1 week since he got sick.  Low risk for withdrawal.  Advised to quit.     # Elevated LFTs Due to ongoing alcohol use.   Repeat LFTs tomorrow a.m.  # Vitamin B12 level 224, Goal >400, started vitamin B12 1000 mcg IM injection daily during hospital stay followed by oral supplement. # Folic acid level 6.4, borderline low, continue oral supplement.   # Abnormal thyroid function test, elevated TSH and free T4 level Recommend to follow with PCP and repeat thyroid profile after 4 weeks  # Morbid obesity Body mass index is 36.45 kg/m.  Interventions: Calorie restricted diet and daily exercise advised to lose body weight     Diet: Regular diet DVT Prophylaxis: Subcutaneous Lovenox   Advance goals of care discussion: Full code  Family Communication: family was not present at bedside, at the time of interview.  The pt provided permission to discuss medical plan with the family. Opportunity was given to ask question and all questions were answered satisfactorily.   Disposition:  Pt is from Home, admitted with hypokalemia, still has provide imbalance, which precludes a safe discharge. Discharge to home, when clinically stable, may need to stay 1-2 more days..  Subjective: No significant events overnight, patient's bilateral lower extremity weakness is getting better, no any other active issues today, denies any chest pain or palpitation no shortness of breath.  Physical Exam: General: NAD, lying comfortably Appear in no distress, affect appropriate Eyes: PERRLA ENT: Oral Mucosa Clear, moist  Neck: no JVD,  Cardiovascular: S1 and S2 Present, no Murmur,  Respiratory: good respiratory effort, Bilateral Air entry equal and Decreased, no Crackles, no wheezes Abdomen: Bowel Sound present, Soft, obese and no tenderness,  Skin: no rashes Extremities: no Pedal edema, no calf tenderness Neurologic: without any new focal findings Gait not checked due to patient safety concerns  Vitals:   08/10/22 0353 08/10/22 0402 08/10/22 0933 08/10/22 1208  BP: (!) 166/74 (!) 147/104 (!) 140/93 (!) 146/99  Pulse: 77 64 68 72  Resp: 19 20 18 18   Temp: 98.6 F (37 C) 98.7 F (37.1 C) 98 F (36.7 C) 97.8 F (36.6 C)  TempSrc:   Oral Oral  SpO2: 100% 100% 100% 100%  Weight:  Height:        Intake/Output Summary (Last 24 hours) at 08/10/2022 1426 Last data filed at 08/10/2022 1100 Gross per 24 hour  Intake 528.64 ml  Output --  Net 528.64 ml   Filed Weights   08/08/22 1143 08/08/22 1147  Weight: 116.8 kg 115.2 kg    Data Reviewed: I have personally reviewed and interpreted daily labs, tele strips, imagings  as discussed above. I reviewed all nursing notes, pharmacy notes, vitals, pertinent old records I have discussed plan of care as described above with RN and patient/family.  CBC: Recent Labs  Lab 08/08/22 1409 08/09/22 0340 08/10/22 0431  WBC 6.4 5.4 7.1  HGB 14.0 12.1* 12.5*  HCT 37.8* 33.2* 34.6*  MCV 96.2 96.8 98.3  PLT 236 224 981   Basic Metabolic Panel: Recent Labs  Lab 08/08/22 1306 08/08/22 2254 08/09/22 0340 08/09/22 1106 08/10/22 0430 08/10/22 0431  NA 135 136 138 140  --  141  K 2.6* 2.5* 2.6* 3.0*  --  3.0*  CL 90* 95* 102 103  --  105  CO2 30 32 29 28  --  28  GLUCOSE 115* 95 91 123*  --  102*  BUN 5* 5* 5* <5*  --  <5*  CREATININE 0.82 0.74 0.68 0.62  --  0.63  CALCIUM 8.5* 7.4* 6.9* 7.2*  --  7.3*  MG 1.8  --   --   --   --  1.7  PHOS  --   --   --   --  2.6  --     Studies: No results found.  Scheduled Meds:  cyanocobalamin  1,000 mcg Intramuscular Daily   Followed by   Derrill Memo ON 08/13/2022] vitamin B-12  500 mcg Oral Daily   enoxaparin (LOVENOX) injection  0.5 mg/kg Subcutaneous X91Y   folic acid  1 mg Oral Daily   potassium chloride  40 mEq Oral Q4H   sodium chloride flush  3 mL Intravenous Q12H   thiamine  100 mg Oral Daily   Continuous Infusions:  magnesium sulfate bolus IVPB     potassium chloride     PRN Meds: acetaminophen **OR** acetaminophen, acetaminophen, ondansetron **OR** ondansetron (ZOFRAN) IV, polyethylene glycol  Time spent: 35 minutes  Author: Val Riles. MD [facility] 08/10/2022 2:26 PM  To reach On-call, see care teams to locate the attending and reach out to them via [email or web address]. If 7PM-7AM, please contact night-coverage If you still have difficulty reaching the attending provider, please page the [facility] (Director on Call) for [facility] on amion for assistance.

## 2022-08-10 NOTE — Evaluation (Signed)
Physical Therapy Evaluation Patient Details Name: Cole Lopez MRN: [id number] DOB: [birth year] Today's Date: 08/10/2022  History of Present Illness  Pt is a 37 y.o. male with medical history significant of alcohol abuse, hepatic steatosis, hyperlipidemia, who presents to the ED with complaints of leg weakness and numbness.  MD assessment includes: Hypokalemia, alcohol abuse, and elevated LFTs.   Clinical Impression  Pt was pleasant and motivated to participate during the session and put forth good effort throughout. BLE's subjectively weaker than at baseline per patient report but MMT 5/5 for BLE ankle DF, knee flex/ext, and hip flex; proprioception WNL with sensation to light touch present but diminished to medial lower legs and thighs. Pt generally steady with good control and stability with transfers, gait, and stairs but with with pt stating subjectively that his legs felt shaky.  Pt did present with difficulty maintaining SLS on each LE with both times initially < 5 sec but with no physical assist needed to prevent LOB during testing.  Discussed OPPT to address subjective deficits in strength and noted deficits in balance with pt declining.           Recommendations for follow up therapy are one component of a multi-disciplinary discharge planning process, led by the attending physician.  Recommendations may be updated based on patient status, additional functional criteria and insurance authorization.  Follow Up Recommendations Outpatient PT (Pt declined recommendation for OPPT)      Assistance Recommended at Discharge None  Patient can return home with the following  Help with stairs or ramp for entrance;Assist for transportation;A little help with walking and/or transfers    Equipment Recommendations None recommended by PT  Recommendations for Other Services       Functional Status Assessment Patient has had a recent decline in their functional status and demonstrates  the ability to make significant improvements in function in a reasonable and predictable amount of time.     Precautions / Restrictions Precautions Precautions: Fall Restrictions Weight Bearing Restrictions: No      Mobility  Bed Mobility Overal bed mobility: Independent                  Transfers Overall transfer level: Independent Equipment used: None               General transfer comment: Good eccentric control and stability    Ambulation/Gait Ambulation/Gait assistance: Supervision Gait Distance (Feet): 250 Feet Assistive device: None Gait Pattern/deviations: WFL(Within Functional Limits) Gait velocity: WNL     General Gait Details: Pt steady with amb including during sharp turns and start/stops  Stairs Stairs: Yes Stairs assistance: Supervision Stair Management: One rail Left, Alternating pattern, Forwards Number of Stairs: 4 General stair comments: Good eccentric and concentric control and stability  Wheelchair Mobility    Modified Rankin (Stroke Patients Only)       Balance Overall balance assessment: Needs assistance   Sitting balance-Leahy Scale: Normal     Standing balance support: No upper extremity supported, During functional activity Standing balance-Leahy Scale: Good   Single Leg Stance - Right Leg: 4 Single Leg Stance - Left Leg: 2           High Level Balance Comments: Pt with BLE SLS < 5 sec initially but no physical assistance required to prevent LOB during testing; with practice RLE SLS time improved to 8 sec and LLE to 5 sec             Pertinent Vitals/Pain Pain Assessment Pain Assessment:  No/denies pain    Home Living Family/patient expects to be discharged to:: Private residence Living Arrangements: Spouse/significant other;Children Available Help at Discharge: Family;Available PRN/intermittently Type of Home: House Home Access: Stairs to enter Entrance Stairs-Rails: Psychiatric nurse  of Steps: 3   Home Layout: One level Home Equipment: None      Prior Function Prior Level of Function : Independent/Modified Independent             Mobility Comments: Ind amb community distances without an AD, works as a Engineer, manufacturing systems, no fall history other than one recent fall associated with this admission ADLs Comments: Ind with ADLs     Hand Dominance        Extremity/Trunk Assessment   Upper Extremity Assessment Upper Extremity Assessment: Overall WFL for tasks assessed    Lower Extremity Assessment Lower Extremity Assessment: Overall WFL for tasks assessed;RLE deficits/detail;LLE deficits/detail RLE Deficits / Details: BLE's subjectively weaker than at baseline per patient but MMT 5/5 for ankle DF, knee flex/ext, and hip flex; decreased light touch to medial lower legs and thighs RLE Sensation: decreased light touch LLE Deficits / Details: BLE's subjectively weaker than at baseline per patient but MMT 5/5 for ankle DF, knee flex/ext, and hip flex; decreased light touch to medial lower legs and thighs LLE Sensation: decreased light touch       Communication   Communication: No difficulties  Cognition Arousal/Alertness: Awake/alert Behavior During Therapy: WFL for tasks assessed/performed Overall Cognitive Status: Within Functional Limits for tasks assessed                                          General Comments      Exercises Other Exercises Other Exercises: Corner balance exercise education provided including on proper progression and use of chair and spotter for safety   Assessment/Plan    PT Assessment Patient needs continued PT services  PT Problem List Decreased strength;Decreased balance       PT Treatment Interventions Therapeutic exercise;Balance training;Patient/family education    PT Goals (Current goals can be found in the Care Plan section)  Acute Rehab PT Goals Patient Stated Goal: To get my strength back PT Goal  Formulation: With patient Time For Goal Achievement: 08/23/22 Potential to Achieve Goals: Good    Frequency Min 2X/week     Co-evaluation               AM-PAC PT "6 Clicks" Mobility  Outcome Measure Help needed turning from your back to your side while in a flat bed without using bedrails?: None Help needed moving from lying on your back to sitting on the side of a flat bed without using bedrails?: None Help needed moving to and from a bed to a chair (including a wheelchair)?: None Help needed standing up from a chair using your arms (e.g., wheelchair or bedside chair)?: None Help needed to walk in hospital room?: A Little Help needed climbing 3-5 steps with a railing? : A Little 6 Click Score: 22    End of Session Equipment Utilized During Treatment: Gait belt Activity Tolerance: Patient tolerated treatment well Patient left: in bed;with call bell/phone within reach Nurse Communication: Mobility status PT Visit Diagnosis: Muscle weakness (generalized) (M62.81);Unsteadiness on feet (R26.81)    Time: 4696-2952 PT Time Calculation (min) (ACUTE ONLY): 24 min   Charges:   PT Evaluation $PT Eval Moderate Complexity: 1 Mod PT  Treatments $Therapeutic Exercise: 8-22 mins       D. Scott Quashawn Jewkes PT, DPT 08/10/22, 11:51 AM

## 2022-08-10 NOTE — Progress Notes (Signed)
OT Cancellation Note  Patient Details Name: Cole Lopez MRN: [id number] DOB: 1985-08-18   Cancelled Treatment:    Reason Eval/Treat Not Completed: OT screened, no needs identified, will sign off. Chart reviewed. Per conversation with PT, pt near baseline functional independence. No skilled acute OT needs identified, will sign off. Please re-consult if new needs arise.   Dessie Coma, M.S. OTR/L  08/10/22, 9:19 AM  ascom [phone]

## 2022-08-11 LAB — BASIC METABOLIC PANEL
Anion gap: 7 (ref 5–15)
BUN: <5 mg/dL — ABNORMAL LOW (ref 6–20)
CO2: 25 mmol/L (ref 22–32)
Calcium: 8.1 mg/dL — ABNORMAL LOW (ref 8.9–10.3)
Chloride: 106 mmol/L (ref 98–111)
Creatinine, Ser: 0.62 mg/dL (ref 0.61–1.24)
GFR, Estimated: >60 mL/min (ref >60)
Glucose, Bld: 100 mg/dL — ABNORMAL HIGH (ref 70–99)
Potassium: 3.8 mmol/L (ref 3.5–5.1)
Sodium: 138 mmol/L (ref 135–145)

## 2022-08-11 LAB — HEPATIC FUNCTION PANEL
ALT: 20 U/L (ref 0–44)
AST: 42 U/L — ABNORMAL HIGH (ref 15–41)
Albumin: 3.2 g/dL — ABNORMAL LOW (ref 3.5–5.0)
Alkaline Phosphatase: 41 U/L (ref 38–126)
Bilirubin, Direct: 0.3 mg/dL — ABNORMAL HIGH (ref 0.0–0.2)
Indirect Bilirubin: 0.8 mg/dL (ref 0.3–0.9)
Total Bilirubin: 1.1 mg/dL (ref 0.3–1.2)
Total Protein: 6.7 g/dL (ref 6.5–8.1)

## 2022-08-11 LAB — CBC
HCT: 36.4 % — ABNORMAL LOW (ref 39.0–52.0)
Hemoglobin: 13 g/dL (ref 13.0–17.0)
MCH: 35.8 pg — ABNORMAL HIGH (ref 26.0–34.0)
MCHC: 35.7 g/dL (ref 30.0–36.0)
MCV: 100.3 fL — ABNORMAL HIGH (ref 80.0–100.0)
Platelets: 317 10*3/uL (ref 150–400)
RBC: 3.63 MIL/uL — ABNORMAL LOW (ref 4.22–5.81)
RDW: 14.2 % (ref 11.5–15.5)
WBC: 8.3 10*3/uL (ref 4.0–10.5)
nRBC: 0 % (ref 0.0–0.2)

## 2022-08-11 LAB — MISC LABCORP TEST (SEND OUT): Labcorp test code: 81950

## 2022-08-11 LAB — MAGNESIUM: Magnesium: 2 mg/dL (ref 1.7–2.4)

## 2022-08-11 LAB — PHOSPHORUS: Phosphorus: 3.2 mg/dL (ref 2.5–4.6)

## 2022-08-11 MED ORDER — CYANOCOBALAMIN 1000 MCG PO TABS: 1000.0000 ug | ORAL_TABLET | Freq: Every day | ORAL | 0 refills | Status: AC

## 2022-08-11 MED ORDER — FOLIC ACID 1 MG PO TABS: 1.0000 mg | ORAL_TABLET | Freq: Every day | ORAL | 0 refills | Status: AC

## 2022-08-11 NOTE — Discharge Summary (Signed)
[facility] Discharge Summary   Patient: Cole Lopez BZJ:[id number]  PCP: Charlynne Cousins, MD (Inactive)  Date of admission: 08/08/2022   Date of discharge:  08/11/2022     Discharge Diagnoses:  Principal Problem:   Lower extremity weakness Active Problems:   Hypokalemia   Alcohol abuse   Elevated LFTs   Admitted From: Home Disposition:  Home   Recommendations for Outpatient Follow-up:  F/u PCP in 1-2 weeks, Repeat TSH/FT4/FT3 in 4-6 weeks, repeat B12, and folate level in 3-4 months F/u with Neurology in 1-2 weeks for EMG/NCS Follow up LABS/TEST:     Diet recommendation: Cardiac diet  Activity: The patient is advised to gradually reintroduce usual activities, as tolerated  Discharge Condition: stable  Code Status: Full code   History of present illness: As per the H and P dictated on admission  Hospital Course:  Cole Lopez is a 37 y.o. male with medical history significant of alcohol abuse, hepatic steatosis, hyperlipidemia, who presents to the ED with complaints of leg weakness.  Patient also had influenza about a week ago, started having nausea vomiting diarrhea.  These symptom has resolved. Patient was found to have severe hypokalemia at 2.6.  MRI of the lumbar spine did not show any acute changes, LP showed normal glucose and protein level.  Assessment and Plan: # Lower extremity weakness most likely secondary to hypokalemia and B12 deficiency.  Neurology consulted, MRI of the lumbar spine and LP negative  Significant weakness might be due to severe hypokalemia. Weakness is gradually improving, patient is feeling little bit numbness in the bilateral lower extremities below the knees otherwise no weakness, ambulating well.  Patient was cleared by neurology to follow-up as an outpatient for possible EMG # Hypokalemia secondary to diarrhea, Diarrhea resolved, Potassium repleted and resolved. Mag  was 1.7 at borderline, repleted. # Alcohol abuse,  Off alcohol for 1 week since he got sick.  Low risk for withdrawal.  Advised to quit. # Elevated LFTs, Due to ongoing alcohol use. LFTs improved # Vitamin B12 level 224, Goal >400, started vitamin B12 1000 mcg IM injection daily during hospital stay followed by oral supplement. # Folic acid level 6.4, borderline low, continue oral supplement. # Abnormal thyroid function test, elevated TSH and free T4 level. Recommend to follow with PCP and repeat thyroid profile 4 to 6 weeks # Morbid obesity: Body mass index is 36.45 kg/m.  Interventions: Calorie restricted diet and daily exercise advised to lose body weight  Patient was ambulatory without any assistance. On the day of the discharge the patient's vitals were stable, and no other acute medical condition were reported by patient. the patient was felt safe to be discharge at Home.  Consultants: Neurology Procedures: Lumbar puncture  Discharge Exam: General: Appear in no distress, no Rash; Oral Mucosa Clear, moist. Cardiovascular: S1 and S2 Present, no Murmur, Respiratory: normal respiratory effort, Bilateral Air entry present and no Crackles, no wheezes Abdomen: Bowel Sound present, Soft and no tenderness, no hernia Extremities: no Pedal edema, no calf tenderness Neurology: alert and oriented to time, place, and person affect appropriate.  Filed Weights   08/08/22 1143 08/08/22 1147  Weight: 116.8 kg 115.2 kg   Vitals:   08/11/22 0437 08/11/22 0908  BP: (!) 140/94 (!) 137/98  Pulse: 63 71  Resp: 20 16  Temp: 98.4 F (36.9 C) 98.4 F (36.9 C)  SpO2: 100% 97%    DISCHARGE MEDICATION: Allergies as of 08/11/2022   No Known Allergies  Medication List     STOP taking these medications    benzonatate 200 MG capsule Commonly known as: TESSALON   ondansetron 4 MG disintegrating tablet Commonly known as: ZOFRAN-ODT   oseltamivir 75 MG capsule Commonly known as: TAMIFLU   triamcinolone cream 0.1 % Commonly known as:  KENALOG       TAKE these medications    cyanocobalamin 1000 MCG tablet Take 1 tablet (1,000 mcg total) by mouth daily. Start taking on: August 13, 2022   fluticasone 50 MCG/ACT nasal spray Commonly known as: FLONASE Place 2 sprays into both nostrils daily. What changed:  when to take this reasons to take this   folic acid 1 MG tablet Commonly known as: FOLVITE Take 1 tablet (1 mg total) by mouth daily. Start taking on: August 12, 2022       No Known Allergies Discharge Instructions     Ambulatory referral to Neurology   Complete by: As directed    Call MD for:  difficulty breathing, headache or visual disturbances   Complete by: As directed    Call MD for:  extreme fatigue   Complete by: As directed    Call MD for:  persistant dizziness or light-headedness   Complete by: As directed    Call MD for:  persistant nausea and vomiting   Complete by: As directed    Call MD for:  severe uncontrolled pain   Complete by: As directed    Call MD for:  temperature >100.4   Complete by: As directed    Diet - low sodium heart healthy   Complete by: As directed    Discharge instructions   Complete by: As directed    F/u PCP in 1-2 weeks, Repeat TSH/FT4/FT3 in 4-6 weeks, repeat B12, and folate level in 3-4 months F/u with Neurology in 1-2 weeks for EMG/NCS   Increase activity slowly   Complete by: As directed        The results of significant diagnostics from this hospitalization (including imaging, microbiology, ancillary and laboratory) are listed below for reference.    Significant Diagnostic Studies: MR Lumbar Spine W Wo Contrast  Result Date: 08/08/2022 CLINICAL DATA:  Lumbar radiculopathy EXAM: MRI LUMBAR SPINE WITHOUT AND WITH CONTRAST TECHNIQUE: Multiplanar and multiecho pulse sequences of the lumbar spine were obtained without and with intravenous contrast. CONTRAST:  26mL GADAVIST GADOBUTROL 1 MMOL/ML IV SOLN COMPARISON:  None Available. FINDINGS: Segmentation:   Standard. Alignment:  Physiologic. Vertebrae: No fracture, evidence of discitis, or bone lesion. Schmorl's node and endplate signal changes at superior endplate of L5. Conus medullaris and cauda equina: Conus extends to the L1 level. Conus and cauda equina appear normal. No abnormal contrast enhancement. Paraspinal and other soft tissues: Negative. Disc levels: L1-L2: Normal disc space and facet joints. No spinal canal stenosis. No neural foraminal stenosis. L2-L3: Normal disc space and facet joints. No spinal canal stenosis. No neural foraminal stenosis. L3-L4: Normal disc space and facet joints. No spinal canal stenosis. No neural foraminal stenosis. L4-L5: Small disc bulge. No spinal canal stenosis. No neural foraminal stenosis. L5-S1: Normal disc space and facet joints. No spinal canal stenosis. No neural foraminal stenosis. Visualized sacrum: Normal. IMPRESSION: Mild degenerative disc disease without spinal canal or neural foraminal stenosis. Electronically Signed   By: Ulyses Jarred M.D.   On: 08/08/2022 22:53   DG FL GUIDED LUMBAR PUNCTURE  Result Date: 08/08/2022 CLINICAL DATA:  Difficulty walking EXAM: DIAGNOSTIC LUMBAR PUNCTURE UNDER FLUOROSCOPIC GUIDANCE COMPARISON:  None Available. FLUOROSCOPY:  Radiation Exposure Index (as provided by the fluoroscopic device): 2.70 mGy Kerma PROCEDURE: This procedure was performed by Reatha Armour, PA-C and supervised and interpreted by Dr Emmit Alexanders. Informed consent was obtained from the patient prior to the procedure, including potential complications of headache, allergy, and pain. With the patient prone, the lower back was prepped with Betadine. 1% Lidocaine was used for local anesthesia. Lumbar puncture was performed at the L2-L3 level using a 20 gauge needle with return of clear CSF with an opening pressure of 18 cm water. 8 ml of CSF were obtained for laboratory studies. The patient tolerated the procedure well and there were no apparent complications.  IMPRESSION: Technically successful lumbar puncture at L2-L3 level. Opening pressure of 18 cm water, a mL of clear CSF obtained for laboratory study. Patient tolerated procedure well with no apparent complication. Electronically Signed   By: Emmit Alexanders M.D.   On: 08/08/2022 16:48   CT Head Wo Contrast  Result Date: 08/08/2022 CLINICAL DATA:  Headache EXAM: CT HEAD WITHOUT CONTRAST TECHNIQUE: Contiguous axial images were obtained from the base of the skull through the vertex without intravenous contrast. RADIATION DOSE REDUCTION: This exam was performed according to the departmental dose-optimization program which includes automated exposure control, adjustment of the mA and/or kV according to patient size and/or use of iterative reconstruction technique. COMPARISON:  None Available. FINDINGS: Brain: No evidence of acute infarction, hemorrhage, hydrocephalus, extra-axial collection or mass lesion/mass effect. Vascular: No hyperdense vessel or unexpected calcification. Skull: Normal. Negative for fracture or focal lesion. Sinuses/Orbits: No mastoid or middle ear effusion. Impacted cerumen in the bilateral EACs. Mucosal thickening left frontal, bilateral ethmoid, bilateral maxillary sinuses. Other: None. IMPRESSION: 1. No acute intracranial abnormality. 2. Mild paranasal sinus disease. Electronically Signed   By: Marin Roberts M.D.   On: 08/08/2022 13:47    Microbiology: Recent Results (from the past 240 hour(s))  CSF culture w Gram Stain     Status: None (Preliminary result)   Collection Time: 08/08/22  4:05 PM   Specimen: PATH Cytology CSF; Cerebrospinal Fluid  Result Value Ref Range Status   Specimen Description   Final    CSF Performed at [facility], [address] [place], [place] [address]    Special Requests   Final    NONE Performed at [facility], [address] [place], [place] [address]    Gram Stain   Final    RED BLOOD CELLS PRESENT WBC SEEN NO ORGANISMS  SEEN Performed at [facility], [address] [place], [place] [address]    Culture   Final    NO GROWTH 2 DAYS Performed at [facility], [place] [address], [place], [place] [address]    Report Status PENDING  Incomplete     Labs: CBC: Recent Labs  Lab 08/08/22 1409 08/09/22 0340 08/10/22 0431 08/11/22 0426  WBC 6.4 5.4 7.1 8.3  HGB 14.0 12.1* 12.5* 13.0  HCT 37.8* 33.2* 34.6* 36.4*  MCV 96.2 96.8 98.3 100.3*  PLT 236 224 271 952   Basic Metabolic Panel: Recent Labs  Lab 08/08/22 1306 08/08/22 2254 08/09/22 0340 08/09/22 1106 08/10/22 0430 08/10/22 0431 08/11/22 0426  NA 135 136 138 140  --  141 138  K 2.6* 2.5* 2.6* 3.0*  --  3.0* 3.8  CL 90* 95* 102 103  --  105 106  CO2 30 32 29 28  --  28 25  GLUCOSE 115* 95 91 123*  --  102*  100*  BUN 5* 5* 5* <5*  --  <5* <5*  CREATININE 0.82 0.74 0.68 0.62  --  0.63 0.62  CALCIUM 8.5* 7.4* 6.9* 7.2*  --  7.3* 8.1*  MG 1.8  --   --   --   --  1.7 2.0  PHOS  --   --   --   --  2.6  --  3.2   Liver Function Tests: Recent Labs  Lab 08/08/22 1306 08/11/22 0426  AST 145* 42*  ALT 39 20  ALKPHOS 54 41  BILITOT 2.4* 1.1  PROT 8.0 6.7  ALBUMIN 3.8 3.2*   No results for input(s): "LIPASE", "AMYLASE" in the last 168 hours. No results for input(s): "AMMONIA" in the last 168 hours. Cardiac Enzymes: No results for input(s): "CKTOTAL", "CKMB", "CKMBINDEX", "TROPONINI" in the last 168 hours. BNP (last 3 results) No results for input(s): "BNP" in the last 8760 hours. CBG: No results for input(s): "GLUCAP" in the last 168 hours.  Time spent: 35 minutes  Signed:  Gillis Santa  [facility]  08/11/2022 9:34 AM

## 2022-08-11 NOTE — Progress Notes (Signed)
STROKE TEAM PROGRESS NOTE   SUBJECTIVE (INTERVAL HISTORY) Weakness improved, still has paresthesias. CSF and MRI so far unrevealing. K+ now 3.0    OBJECTIVE Temp:  [97.8 F (36.6 C)-99 F (37.2 C)] 98.4 F (36.9 C) (02/01 0437) Pulse Rate:  [57-72] 63 (02/01 0437) Cardiac Rhythm: Sinus bradycardia (02/01 0719) Resp:  [17-20] 20 (02/01 0437) BP: (140-150)/(92-99) 140/94 (02/01 0437) SpO2:  [99 %-100 %] 100 % (02/01 0437)  No results for input(s): "GLUCAP" in the last 168 hours. Recent Labs  Lab 08/08/22 1306 08/08/22 2254 08/09/22 0340 08/09/22 1106 08/10/22 0430 08/10/22 0431 08/11/22 0426  NA 135 136 138 140  --  141 138  K 2.6* 2.5* 2.6* 3.0*  --  3.0* 3.8  CL 90* 95* 102 103  --  105 106  CO2 30 32 29 28  --  28 25  GLUCOSE 115* 95 91 123*  --  102* 100*  BUN 5* 5* 5* <5*  --  <5* <5*  CREATININE 0.82 0.74 0.68 0.62  --  0.63 0.62  CALCIUM 8.5* 7.4* 6.9* 7.2*  --  7.3* 8.1*  MG 1.8  --   --   --   --  1.7 2.0  PHOS  --   --   --   --  2.6  --  3.2    Recent Labs  Lab 08/08/22 1306 08/11/22 0426  AST 145* 42*  ALT 39 20  ALKPHOS 54 41  BILITOT 2.4* 1.1  PROT 8.0 6.7  ALBUMIN 3.8 3.2*    Recent Labs  Lab 08/08/22 1409 08/09/22 0340 08/10/22 0431 08/11/22 0426  WBC 6.4 5.4 7.1 8.3  HGB 14.0 12.1* 12.5* 13.0  HCT 37.8* 33.2* 34.6* 36.4*  MCV 96.2 96.8 98.3 100.3*  PLT 236 224 271 317    No results for input(s): "CKTOTAL", "CKMB", "CKMBINDEX", "TROPONINI" in the last 168 hours. Recent Labs    08/08/22 1306  LABPROT 14.0  INR 1.1    No results for input(s): "COLORURINE", "LABSPEC", "PHURINE", "GLUCOSEU", "HGBUR", "BILIRUBINUR", "KETONESUR", "PROTEINUR", "UROBILINOGEN", "NITRITE", "LEUKOCYTESUR" in the last 72 hours.  Invalid input(s): "APPERANCEUR"     Component Value Date/Time   CHOL 201 (H) 04/20/2022 1501   TRIG 190 (H) 04/20/2022 1501   HDL 27 (L) 04/20/2022 1501   CHOLHDL 8.3 (H) 04/06/2021 0946   LDLCALC 139 (H) 04/20/2022 1501    No results found for: "HGBA1C" No results found for: "LABOPIA", "COCAINSCRNUR", "LABBENZ", "AMPHETMU", "THCU", "LABBARB"  No results for input(s): "ETH" in the last 168 hours.  I have personally reviewed the radiological images below and agree with the radiology interpretations.  MR Lumbar Spine W Wo Contrast  Result Date: 08/08/2022 CLINICAL DATA:  Lumbar radiculopathy EXAM: MRI LUMBAR SPINE WITHOUT AND WITH CONTRAST TECHNIQUE: Multiplanar and multiecho pulse sequences of the lumbar spine were obtained without and with intravenous contrast. CONTRAST:  78mL GADAVIST GADOBUTROL 1 MMOL/ML IV SOLN COMPARISON:  None Available. FINDINGS: Segmentation:  Standard. Alignment:  Physiologic. Vertebrae: No fracture, evidence of discitis, or bone lesion. Schmorl's node and endplate signal changes at superior endplate of L5. Conus medullaris and cauda equina: Conus extends to the L1 level. Conus and cauda equina appear normal. No abnormal contrast enhancement. Paraspinal and other soft tissues: Negative. Disc levels: L1-L2: Normal disc space and facet joints. No spinal canal stenosis. No neural foraminal stenosis. L2-L3: Normal disc space and facet joints. No spinal canal stenosis. No neural foraminal stenosis. L3-L4: Normal disc space and facet joints. No spinal canal  stenosis. No neural foraminal stenosis. L4-L5: Small disc bulge. No spinal canal stenosis. No neural foraminal stenosis. L5-S1: Normal disc space and facet joints. No spinal canal stenosis. No neural foraminal stenosis. Visualized sacrum: Normal. IMPRESSION: Mild degenerative disc disease without spinal canal or neural foraminal stenosis. Electronically Signed   By: Ulyses Jarred M.D.   On: 08/08/2022 22:53   DG FL GUIDED LUMBAR PUNCTURE  Result Date: 08/08/2022 CLINICAL DATA:  Difficulty walking EXAM: DIAGNOSTIC LUMBAR PUNCTURE UNDER FLUOROSCOPIC GUIDANCE COMPARISON:  None Available. FLUOROSCOPY: Radiation Exposure Index (as provided by the  fluoroscopic device): 2.70 mGy Kerma PROCEDURE: This procedure was performed by Reatha Armour, PA-C and supervised and interpreted by Dr Emmit Alexanders. Informed consent was obtained from the patient prior to the procedure, including potential complications of headache, allergy, and pain. With the patient prone, the lower back was prepped with Betadine. 1% Lidocaine was used for local anesthesia. Lumbar puncture was performed at the L2-L3 level using a 20 gauge needle with return of clear CSF with an opening pressure of 18 cm water. 8 ml of CSF were obtained for laboratory studies. The patient tolerated the procedure well and there were no apparent complications. IMPRESSION: Technically successful lumbar puncture at L2-L3 level. Opening pressure of 18 cm water, a mL of clear CSF obtained for laboratory study. Patient tolerated procedure well with no apparent complication. Electronically Signed   By: Emmit Alexanders M.D.   On: 08/08/2022 16:48   CT Head Wo Contrast  Result Date: 08/08/2022 CLINICAL DATA:  Headache EXAM: CT HEAD WITHOUT CONTRAST TECHNIQUE: Contiguous axial images were obtained from the base of the skull through the vertex without intravenous contrast. RADIATION DOSE REDUCTION: This exam was performed according to the departmental dose-optimization program which includes automated exposure control, adjustment of the mA and/or kV according to patient size and/or use of iterative reconstruction technique. COMPARISON:  None Available. FINDINGS: Brain: No evidence of acute infarction, hemorrhage, hydrocephalus, extra-axial collection or mass lesion/mass effect. Vascular: No hyperdense vessel or unexpected calcification. Skull: Normal. Negative for fracture or focal lesion. Sinuses/Orbits: No mastoid or middle ear effusion. Impacted cerumen in the bilateral EACs. Mucosal thickening left frontal, bilateral ethmoid, bilateral maxillary sinuses. Other: None. IMPRESSION: 1. No acute intracranial  abnormality. 2. Mild paranasal sinus disease. Electronically Signed   By: Marin Roberts M.D.   On: 08/08/2022 13:47     PHYSICAL EXAM  Temp:  [97.8 F (36.6 C)-99 F (37.2 C)] 98.4 F (36.9 C) (02/01 0437) Pulse Rate:  [57-72] 63 (02/01 0437) Resp:  [17-20] 20 (02/01 0437) BP: (140-150)/(92-99) 140/94 (02/01 0437) SpO2:  [99 %-100 %] 100 % (02/01 0437)  General - Well nourished, well developed, in no apparent distress.  Ophthalmologic - fundi not visualized due to noncooperation.  Cardiovascular - Regular rhythm and rate.  Mental Status -  Level of arousal and orientation to time, place, and person were intact. Language including expression, naming, repetition, comprehension was assessed and found intact. Attention span and concentration were normal. Recent and remote memory were intact. Fund of Knowledge was assessed and was intact.  Cranial Nerves II - XII - II - Visual field intact OU. III, IV, VI - Extraocular movements intact. V - Facial sensation intact bilaterally. VII - Facial movement intact bilaterally. VIII - Hearing & vestibular intact bilaterally. X - Palate elevates symmetrically. XI - Chin turning & shoulder shrug intact bilaterally. XII - Tongue protrusion intact.  Motor Strength - The patient's strength was normal in all extremities and pronator drift  was absent.  Bulk was normal and fasciculations were absent.   Motor Tone - Muscle tone was assessed at the neck and appendages and was normal.  Reflexes - The patient's reflexes were diminished in all extremities.  Sensory - Light touch, temperature/pinprick were assessed and were symmetrical except mildly decreased b/l anterior thigh and b/l calves sensation.    Coordination - The patient had normal movements in the hands and feet with no ataxia or dysmetria.  Tremor was absent.  Gait and Station - deferred.   ASSESSMENT/PLAN Mr. Jianni Batten is a 37 y.o. male with PMH of HLD and alcohol  use had flu one week ago and resolved. Developed b/l LE numbness, started from above ankle and ascending to b/l thigh, however, sparing b/l feet. Felt heaviness in the legs but exam normal strength. No other involvement, but does have diminished DTR throughout.   DDx include: Hypokalemia - K 2.6->2.6->3.0. This can explain the diminished DTRs but hard to explain the b/l leg numbness. Continue K supplement.  B12 deficiency 224 - may explain paresthesias ? GBS - pt has no weakness and numbness only involves calves and anterior thigh, getting better now. CSF unrevealing and MRI L spine no conus enhancement.  ? 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 The sensory distribution not typical either.     Recommend: Continue K supplement and normalization Continue B12 supplementation I will refer to outpatient neurology for consideration of EMG. No further inpatient neurologic workup indicated  Su Monks, MD [facility] [phone]  If 7pm- 7am, please page neurology on call as listed in [place].

## 2022-08-12 ENCOUNTER — Inpatient Hospital Stay: Payer: Self-pay | Admitting: Family Medicine

## 2022-08-12 LAB — CSF CULTURE W GRAM STAIN: Culture: NO GROWTH

## 2022-08-12 LAB — MISC LABCORP TEST (SEND OUT): Labcorp test code: 81950

## 2022-08-16 ENCOUNTER — Inpatient Hospital Stay: Payer: Self-pay | Admitting: Family Medicine

## 2022-08-18 ENCOUNTER — Ambulatory Visit (INDEPENDENT_AMBULATORY_CARE_PROVIDER_SITE_OTHER): Payer: 59 | Admitting: Family Medicine

## 2022-08-18 ENCOUNTER — Encounter: Payer: Self-pay | Admitting: Family Medicine

## 2022-08-18 VITALS — BP 124/84 | HR 101 | Temp 98.5°F | Wt 244.6 lb

## 2022-08-18 DIAGNOSIS — E876 Hypokalemia: Secondary | ICD-10-CM

## 2022-08-18 DIAGNOSIS — R7989 Other specified abnormal findings of blood chemistry: Secondary | ICD-10-CM | POA: Diagnosis not present

## 2022-08-18 DIAGNOSIS — R29898 Other symptoms and signs involving the musculoskeletal system: Secondary | ICD-10-CM

## 2022-08-18 DIAGNOSIS — E538 Deficiency of other specified B group vitamins: Secondary | ICD-10-CM | POA: Diagnosis not present

## 2022-08-18 DIAGNOSIS — F1010 Alcohol abuse, uncomplicated: Secondary | ICD-10-CM

## 2022-08-18 MED ORDER — CYANOCOBALAMIN 1000 MCG/ML IJ SOLN
1000.0000 ug | Freq: Once | INTRAMUSCULAR | Status: AC
  Administered 2022-08-18: 1000 ug via INTRAMUSCULAR

## 2022-08-18 NOTE — Progress Notes (Signed)
BP 124/84   Pulse (!) 101   Temp 98.5 F (36.9 C) (Oral)   Wt 244 lb 9.6 oz (110.9 kg)   SpO2 98%   BMI 35.10 kg/m    Subjective:    Patient ID: Cole Lopez, male    DOB: 1986/02/27, 37 y.o.   MRN: [id number]  HPI: Cole Lopez is a 37 y.o. male  Chief Complaint  Patient presents with   Hospitalization Follow-up   Transition of [facility] Follow up.   Hospital/Facility: [facility] D/C Physician: Dr. Dwyane Dee D/C Date: 08/11/22  Records Requested: 08/18/22 Records Received: 08/18/22 Records Reviewed: 08/18/22  Diagnoses on Discharge:   Lower extremity weakness   Hypokalemia   Alcohol abuse   Elevated LFTs   Date of interactive Contact within 48 hours of discharge: NOT DONE  Date of 7 day or 14 day face-to-face visit:  08/18/22  within 7 days  Outpatient Encounter Medications as of 08/18/2022  Medication Sig   cyanocobalamin 1000 MCG tablet Take 1 tablet (1,000 mcg total) by mouth daily.   folic acid (FOLVITE) 1 MG tablet Take 1 tablet (1 mg total) by mouth daily.   [DISCONTINUED] fluticasone (FLONASE) 50 MCG/ACT nasal spray Place 2 sprays into both nostrils daily. (Patient taking differently: Place 2 sprays into both nostrils 2 (two) times daily as needed for allergies.)   [EXPIRED] cyanocobalamin (VITAMIN B12) injection 1,000 mcg    No facility-administered encounter medications on file as of 08/18/2022.  Per Hospitalist: "Hospital Course:  Cole Lopez is a 37 y.o. male with medical history significant of alcohol abuse, hepatic steatosis, hyperlipidemia, who presents to the ED with complaints of leg weakness.  Patient also had influenza about a week ago, started having nausea vomiting diarrhea.  These symptom has resolved. Patient was found to have severe hypokalemia at 2.6.  MRI of the lumbar spine did not show any acute changes, LP showed normal glucose and protein level.  Assessment and Plan: # Lower extremity weakness most likely secondary  to hypokalemia and B12 deficiency.  Neurology consulted, MRI of the lumbar spine and LP negative  Significant weakness might be due to severe hypokalemia. Weakness is gradually improving, patient is feeling little bit numbness in the bilateral lower extremities below the knees otherwise no weakness, ambulating well.  Patient was cleared by neurology to follow-up as an outpatient for possible EMG # Hypokalemia secondary to diarrhea, Diarrhea resolved, Potassium repleted and resolved. Mag  was 1.7 at borderline, repleted. # Alcohol abuse, Off alcohol for 1 week since he got sick.  Low risk for withdrawal.  Advised to quit. # Elevated LFTs, Due to ongoing alcohol use. LFTs improved # Vitamin B12 level 224, Goal >400, started vitamin B12 1000 mcg IM injection daily during hospital stay followed by oral supplement. # Folic acid level 6.4, borderline low, continue oral supplement. # Abnormal thyroid function test, elevated TSH and free T4 level. Recommend to follow with PCP and repeat thyroid profile 4 to 6 weeks # Morbid obesity: Body mass index is 36.45 kg/m.  Interventions: Calorie restricted diet and daily exercise advised to lose body weight"  Diagnostic Tests Reviewed: Narrative & Impression  CLINICAL DATA:  Lumbar radiculopathy   EXAM: MRI LUMBAR SPINE WITHOUT AND WITH CONTRAST   TECHNIQUE: Multiplanar and multiecho pulse sequences of the lumbar spine were obtained without and with intravenous contrast.   CONTRAST:  18m GADAVIST GADOBUTROL 1 MMOL/ML IV SOLN   COMPARISON:  None Available.   FINDINGS: Segmentation:  Standard.  Alignment:  Physiologic.   Vertebrae: No fracture, evidence of discitis, or bone lesion. Schmorl's node and endplate signal changes at superior endplate of L5.   Conus medullaris and cauda equina: Conus extends to the L1 level. Conus and cauda equina appear normal. No abnormal contrast enhancement.   Paraspinal and other soft tissues: Negative.   Disc  levels:   L1-L2: Normal disc space and facet joints. No spinal canal stenosis. No neural foraminal stenosis.   L2-L3: Normal disc space and facet joints. No spinal canal stenosis. No neural foraminal stenosis.   L3-L4: Normal disc space and facet joints. No spinal canal stenosis. No neural foraminal stenosis.   L4-L5: Small disc bulge. No spinal canal stenosis. No neural foraminal stenosis.   L5-S1: Normal disc space and facet joints. No spinal canal stenosis. No neural foraminal stenosis.   Visualized sacrum: Normal.   IMPRESSION: Mild degenerative disc disease without spinal canal or neural foraminal stenosis.   CLINICAL DATA:  Difficulty walking   EXAM: DIAGNOSTIC LUMBAR PUNCTURE UNDER FLUOROSCOPIC GUIDANCE   COMPARISON:  None Available.   FLUOROSCOPY: Radiation Exposure Index (as provided by the fluoroscopic device): 2.70 mGy Kerma   PROCEDURE: This procedure was performed by Reatha Armour, PA-C and supervised and interpreted by Dr Emmit Alexanders.   Informed consent was obtained from the patient prior to the procedure, including potential complications of headache, allergy, and pain. With the patient prone, the lower back was prepped with Betadine. 1% Lidocaine was used for local anesthesia. Lumbar puncture was performed at the L2-L3 level using a 20 gauge needle with return of clear CSF with an opening pressure of 18 cm water. 8 ml of CSF were obtained for laboratory studies. The patient tolerated the procedure well and there were no apparent complications.   IMPRESSION: Technically successful lumbar puncture at L2-L3 level. Opening pressure of 18 cm water, a mL of clear CSF obtained for laboratory study. Patient tolerated procedure well with no apparent complication.  Narrative & Impression  CLINICAL DATA:  Headache   EXAM: CT HEAD WITHOUT CONTRAST   TECHNIQUE: Contiguous axial images were obtained from the base of the skull through the vertex  without intravenous contrast.   RADIATION DOSE REDUCTION: This exam was performed according to the departmental dose-optimization program which includes automated exposure control, adjustment of the mA and/or kV according to patient size and/or use of iterative reconstruction technique.   COMPARISON:  None Available.   FINDINGS: Brain: No evidence of acute infarction, hemorrhage, hydrocephalus, extra-axial collection or mass lesion/mass effect.   Vascular: No hyperdense vessel or unexpected calcification.   Skull: Normal. Negative for fracture or focal lesion.   Sinuses/Orbits: No mastoid or middle ear effusion. Impacted cerumen in the bilateral EACs. Mucosal thickening left frontal, bilateral ethmoid, bilateral maxillary sinuses.   Other: None.   IMPRESSION: 1. No acute intracranial abnormality. 2. Mild paranasal sinus disease.   Disposition: Home  Consults: Neurology  Discharge Instructions:  F/u PCP in 1-2 weeks, Repeat TSH/FT4/FT3 in 4-6 weeks, repeat B12, and folate level in 3-4 months F/u with Neurology in 1-2 weeks for EMG/NCS Follow up LABS/TEST:    Disease/illness Education: Discussed today  Home Health/Community Services Discussions/Referrals: N/A  Establishment or re-establishment of referral orders for community resources: N/A  Discussion with other health care providers: N/A  Assessment and Support of treatment regimen adherence: [place] with: Patient  Education for self-management, independent living, and ADLs: Discussed today  He has been feeling well since he got home. He  notes that his R foot started cramping up on Tuesday. He notes that he can't put any pressure on the foot. He was able to "pop" his ankle and it felt a lot better. He denies any significant numbness, but does note that he has had a little weakness. He has not had a drink since his hospitalization. He denies any cravings and does not want to have any medications  to help with abstinence. He would like to be returned to work. He works doing Engineer, production. No other concerns or complaints at this time.   Relevant past medical, surgical, family and social history reviewed and updated as indicated. Interim medical history since our last visit reviewed. Allergies and medications reviewed and updated.  Review of Systems  Constitutional: Negative.   Respiratory: Negative.    Cardiovascular: Negative.   Musculoskeletal:  Positive for arthralgias and myalgias. Negative for back pain, gait problem, joint swelling, neck pain and neck stiffness.  Skin: Negative.   Neurological:  Positive for numbness. Negative for dizziness, tremors, seizures, syncope, facial asymmetry, speech difficulty, weakness, light-headedness and headaches.  Psychiatric/Behavioral: Negative.      Per HPI unless specifically indicated above     Objective:    BP 124/84   Pulse (!) 101   Temp 98.5 F (36.9 C) (Oral)   Wt 244 lb 9.6 oz (110.9 kg)   SpO2 98%   BMI 35.10 kg/m   Wt Readings from Last 3 Encounters:  08/18/22 244 lb 9.6 oz (110.9 kg)  08/08/22 254 lb (115.2 kg)  04/20/22 257 lb 9.6 oz (116.8 kg)    Physical Exam Vitals and nursing note reviewed.  Constitutional:      General: He is not in acute distress.    Appearance: Normal appearance. He is obese. He is not ill-appearing, toxic-appearing or diaphoretic.  HENT:     Head: Normocephalic and atraumatic.     Right Ear: External ear normal.     Left Ear: External ear normal.     Nose: Nose normal.     Mouth/Throat:     Mouth: Mucous membranes are moist.     Pharynx: Oropharynx is clear.  Eyes:     General: No scleral icterus.       Right eye: No discharge.        Left eye: No discharge.     Extraocular Movements: Extraocular movements intact.     Conjunctiva/sclera: Conjunctivae normal.     Pupils: Pupils are equal, round, and reactive to light.  Cardiovascular:     Rate and Rhythm: Normal  rate and regular rhythm.     Pulses: Normal pulses.     Heart sounds: Normal heart sounds. No murmur heard.    No friction rub. No gallop.  Pulmonary:     Effort: Pulmonary effort is normal. No respiratory distress.     Breath sounds: Normal breath sounds. No stridor. No wheezing, rhonchi or rales.  Chest:     Chest wall: No tenderness.  Musculoskeletal:        General: Normal range of motion.     Cervical back: Normal range of motion and neck supple.  Skin:    General: Skin is warm and dry.     Capillary Refill: Capillary refill takes less than 2 seconds.     Coloration: Skin is not jaundiced or pale.     Findings: No bruising, erythema, lesion or rash.  Neurological:     General: No focal deficit present.  Mental Status: He is alert and oriented to person, place, and time. Mental status is at baseline.  Psychiatric:        Mood and Affect: Mood normal.        Behavior: Behavior normal.        Thought Content: Thought content normal.        Judgment: Judgment normal.     Results for orders placed or performed in visit on 08/18/22  CBC with Differential/Platelet  Result Value Ref Range   WBC 9.6 3.4 - 10.8 x10E3/uL   RBC 4.21 4.14 - 5.80 x10E6/uL   Hemoglobin 15.3 13.0 - 17.7 g/dL   Hematocrit 42.5 37.5 - 51.0 %   MCV 101 (H) 79 - 97 fL   MCH 36.3 (H) 26.6 - 33.0 pg   MCHC 36.0 (H) 31.5 - 35.7 g/dL   RDW 13.0 11.6 - 15.4 %   Platelets 461 (H) 150 - 450 x10E3/uL   Neutrophils 68 Not Estab. %   Lymphs 23 Not Estab. %   Monocytes 6 Not Estab. %   Eos 2 Not Estab. %   Basos 1 Not Estab. %   Neutrophils Absolute 6.5 1.4 - 7.0 x10E3/uL   Lymphocytes Absolute 2.2 0.7 - 3.1 x10E3/uL   Monocytes Absolute 0.6 0.1 - 0.9 x10E3/uL   EOS (ABSOLUTE) 0.2 0.0 - 0.4 x10E3/uL   Basophils Absolute 0.1 0.0 - 0.2 x10E3/uL   Immature Granulocytes 0 Not Estab. %   Immature Grans (Abs) 0.0 0.0 - 0.1 x10E3/uL  Comprehensive metabolic panel  Result Value Ref Range   Glucose 131 (H) 70 -  99 mg/dL   BUN 10 6 - 20 mg/dL   Creatinine, Ser 0.73 (L) 0.76 - 1.27 mg/dL   eGFR 119 >59 mL/min/1.73   BUN/Creatinine Ratio 14 9 - 20   Sodium 141 134 - 144 mmol/L   Potassium 4.7 3.5 - 5.2 mmol/L   Chloride 104 96 - 106 mmol/L   CO2 24 20 - 29 mmol/L   Calcium 10.2 8.7 - 10.2 mg/dL   Total Protein 7.4 6.0 - 8.5 g/dL   Albumin 4.2 4.1 - 5.1 g/dL   Globulin, Total 3.2 1.5 - 4.5 g/dL   Albumin/Globulin Ratio 1.3 1.2 - 2.2   Bilirubin Total 0.7 0.0 - 1.2 mg/dL   Alkaline Phosphatase 60 44 - 121 IU/L   AST 66 (H) 0 - 40 IU/L   ALT 58 (H) 0 - 44 IU/L  Magnesium  Result Value Ref Range   Magnesium 2.2 1.6 - 2.3 mg/dL  Phosphorus  Result Value Ref Range   Phosphorus 3.1 2.8 - 4.1 mg/dL      Assessment & Plan:   Problem List Items Addressed This Visit       Other   Alcohol abuse    Encouraged continued abstinence. Declines any naltrexone. Call with any concerns.       Elevated LFTs - Primary    Rechecking labs today. Await results. Treat as needed. Call with any concerns.       Relevant Orders   CBC with Differential/Platelet (Completed)   Comprehensive metabolic panel (Completed)   Magnesium (Completed)   Phosphorus (Completed)   Lower extremity weakness    Significantly better, but not 100% resolved. Given his type of work, advised him to hold off on returning to work until he sees neurology. Referral placed today to get him in ASAP. Continue to monitor. Call with any concerns.       Relevant Orders  CBC with Differential/Platelet (Completed)   Magnesium (Completed)   Phosphorus (Completed)   Ambulatory referral to Neurology   Hypokalemia    Rechecking labs today. Await results. Treat as needed. Call with any concerns.       Relevant Orders   CBC with Differential/Platelet (Completed)   Magnesium (Completed)   Phosphorus (Completed)   B12 deficiency    Did not get Rx for B12. We will start B12 shots. Return in 1 week x 4 weeks, then monthly. Call with any  concerns.         Follow up plan: Return in about 4 weeks (around 09/15/2022) for OK to cancel appt with Santiago Glad.   >25 minutes spent with patient today

## 2022-08-19 LAB — COMPREHENSIVE METABOLIC PANEL
ALT: 58 IU/L — ABNORMAL HIGH (ref 0–44)
AST: 66 IU/L — ABNORMAL HIGH (ref 0–40)
Albumin/Globulin Ratio: 1.3 (ref 1.2–2.2)
Albumin: 4.2 g/dL (ref 4.1–5.1)
Alkaline Phosphatase: 60 IU/L (ref 44–121)
BUN/Creatinine Ratio: 14 (ref 9–20)
BUN: 10 mg/dL (ref 6–20)
Bilirubin Total: 0.7 mg/dL (ref 0.0–1.2)
CO2: 24 mmol/L (ref 20–29)
Calcium: 10.2 mg/dL (ref 8.7–10.2)
Chloride: 104 mmol/L (ref 96–106)
Creatinine, Ser: 0.73 mg/dL — ABNORMAL LOW (ref 0.76–1.27)
Globulin, Total: 3.2 g/dL (ref 1.5–4.5)
Glucose: 131 mg/dL — ABNORMAL HIGH (ref 70–99)
Potassium: 4.7 mmol/L (ref 3.5–5.2)
Sodium: 141 mmol/L (ref 134–144)
Total Protein: 7.4 g/dL (ref 6.0–8.5)
eGFR: 119 mL/min/{1.73_m2} (ref >59)

## 2022-08-19 LAB — CBC WITH DIFFERENTIAL/PLATELET
Basophils Absolute: 0.1 10*3/uL (ref 0.0–0.2)
Basos: 1 %
EOS (ABSOLUTE): 0.2 10*3/uL (ref 0.0–0.4)
Eos: 2 %
Hematocrit: 42.5 % (ref 37.5–51.0)
Hemoglobin: 15.3 g/dL (ref 13.0–17.7)
Immature Grans (Abs): 0 10*3/uL (ref 0.0–0.1)
Immature Granulocytes: 0 %
Lymphocytes Absolute: 2.2 10*3/uL (ref 0.7–3.1)
Lymphs: 23 %
MCH: 36.3 pg — ABNORMAL HIGH (ref 26.6–33.0)
MCHC: 36 g/dL — ABNORMAL HIGH (ref 31.5–35.7)
MCV: 101 fL — ABNORMAL HIGH (ref 79–97)
Monocytes Absolute: 0.6 10*3/uL (ref 0.1–0.9)
Monocytes: 6 %
Neutrophils Absolute: 6.5 10*3/uL (ref 1.4–7.0)
Neutrophils: 68 %
Platelets: 461 10*3/uL — ABNORMAL HIGH (ref 150–450)
RBC: 4.21 x10E6/uL (ref 4.14–5.80)
RDW: 13 % (ref 11.6–15.4)
WBC: 9.6 10*3/uL (ref 3.4–10.8)

## 2022-08-19 LAB — PHOSPHORUS: Phosphorus: 3.1 mg/dL (ref 2.8–4.1)

## 2022-08-19 LAB — MAGNESIUM: Magnesium: 2.2 mg/dL (ref 1.6–2.3)

## 2022-08-21 DIAGNOSIS — E538 Deficiency of other specified B group vitamins: Secondary | ICD-10-CM | POA: Insufficient documentation

## 2022-08-21 NOTE — Assessment & Plan Note (Signed)
Significantly better, but not 100% resolved. Given his type of work, advised him to hold off on returning to work until he sees neurology. Referral placed today to get him in ASAP. Continue to monitor. Call with any concerns.

## 2022-08-21 NOTE — Assessment & Plan Note (Signed)
Rechecking labs today. Await results. Treat as needed. Call with any concerns.

## 2022-08-21 NOTE — Assessment & Plan Note (Signed)
Did not get Rx for B12. We will start B12 shots. Return in 1 week x 4 weeks, then monthly. Call with any concerns.

## 2022-08-21 NOTE — Assessment & Plan Note (Signed)
Encouraged continued abstinence. Declines any naltrexone. Call with any concerns.

## 2022-08-23 ENCOUNTER — Telehealth: Payer: Self-pay | Admitting: Family Medicine

## 2022-08-23 NOTE — Telephone Encounter (Signed)
Pt dropped off FMLA paperwork and said that it was needed for when he went to go see the neurologist.  To call when it is complete.  Put in provider's folder.

## 2022-08-25 ENCOUNTER — Telehealth: Payer: Self-pay

## 2022-08-25 NOTE — Telephone Encounter (Signed)
Patient stopped by the office today to follow up with the status of his requested FMLA paperwork? Paperwork was placed in provider's folder for review. Please advise?

## 2022-08-26 NOTE — Telephone Encounter (Signed)
Pt returned call but doesn't know who called or why  CB#  [phone]

## 2022-08-26 NOTE — Telephone Encounter (Signed)
Patient was called to inform his that his FMLA paperwork was ready for pick up. Patient stopped by and picked up requested paperwork.

## 2022-09-15 ENCOUNTER — Ambulatory Visit: Payer: 59 | Admitting: Family Medicine

## 2022-09-30 IMAGING — US US ABDOMEN COMPLETE
1 series · 14 of 25 positions shown · non-contrast
Comparison: None.

CLINICAL DATA: Elevated liver enzyme

EXAM:
ABDOMEN ULTRASOUND COMPLETE

[Series 1: us abdomen complete · 0.25mm/px · 14 of 98 slices shown]
[im 1/98]
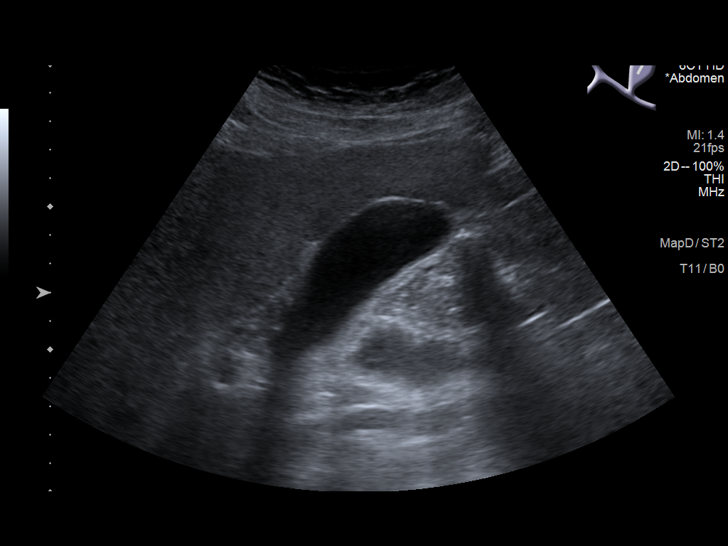
[im 9/98]
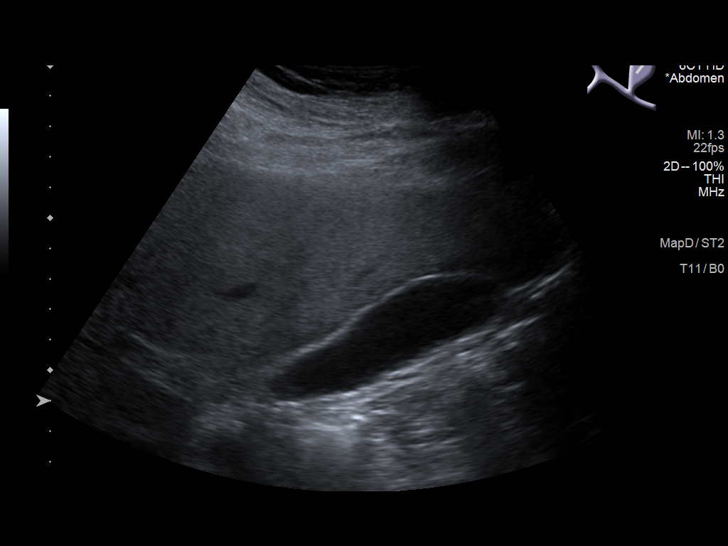
[im 17/98]
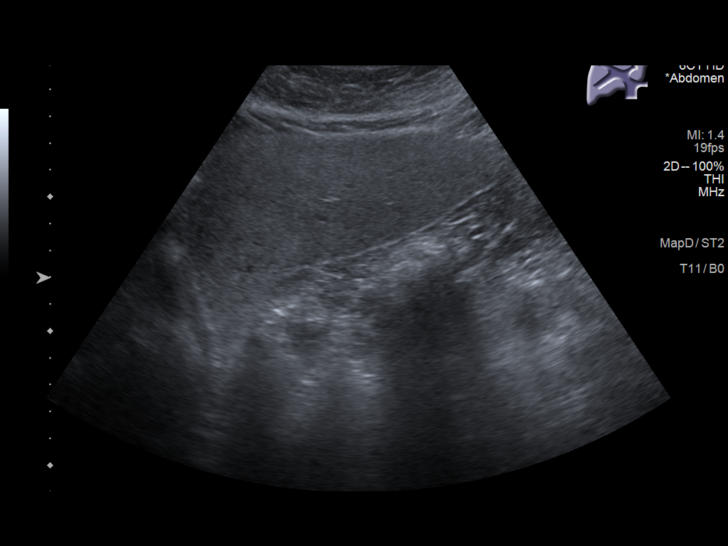
[im 25/98]
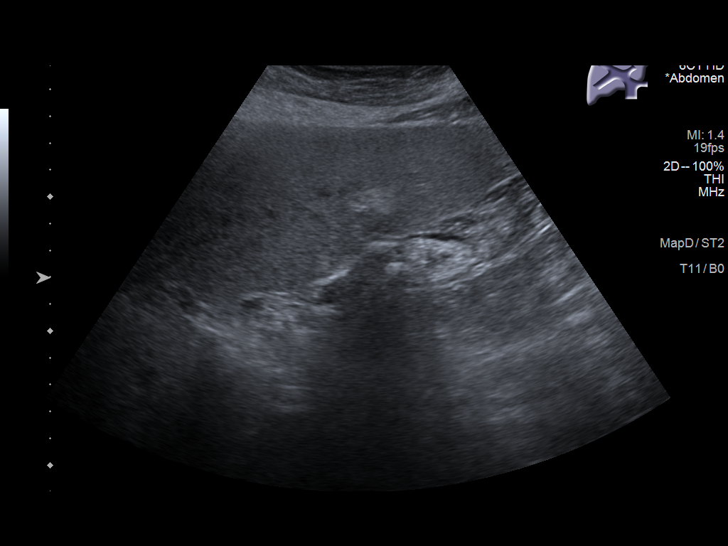
[im 33/98]
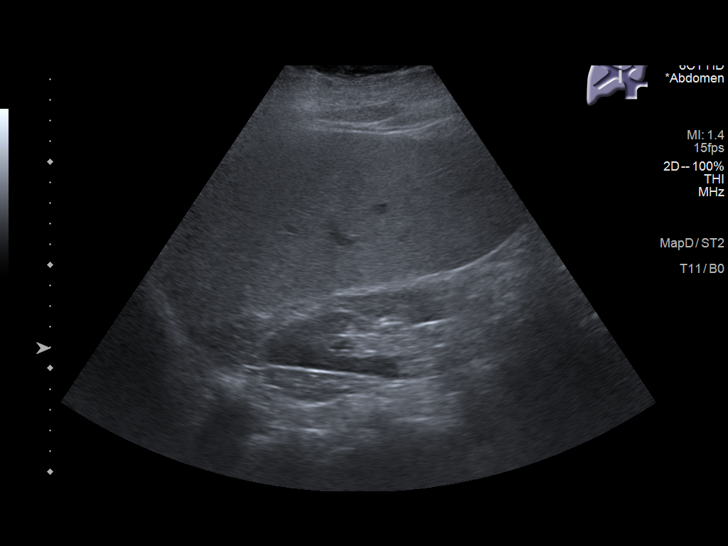
[im 37/98]
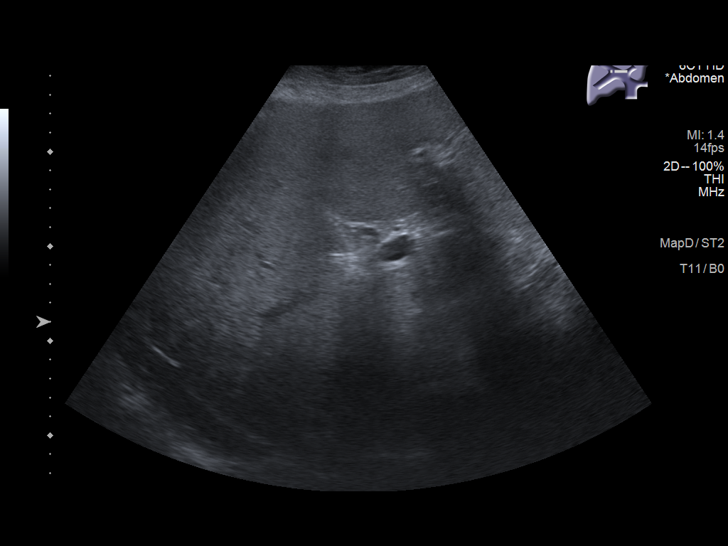
[im 45/98]
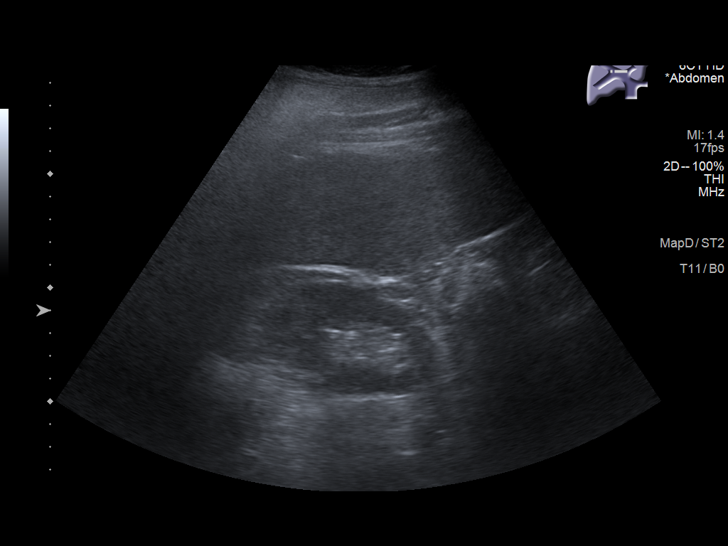
[im 53/98]
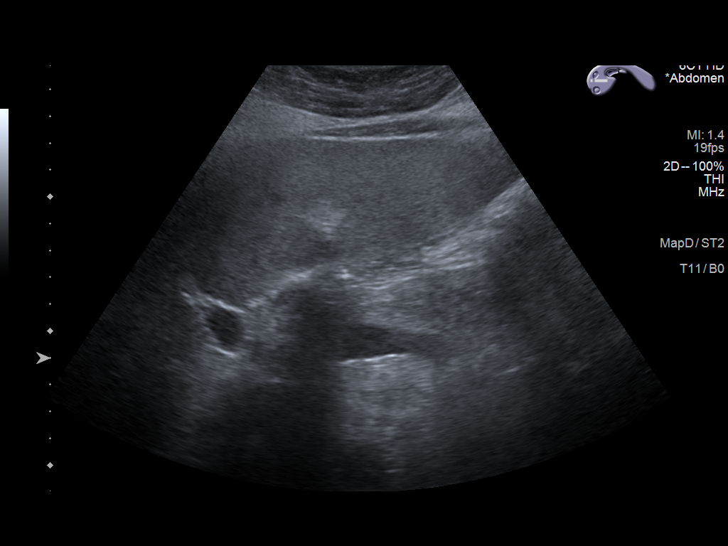
[im 61/98]
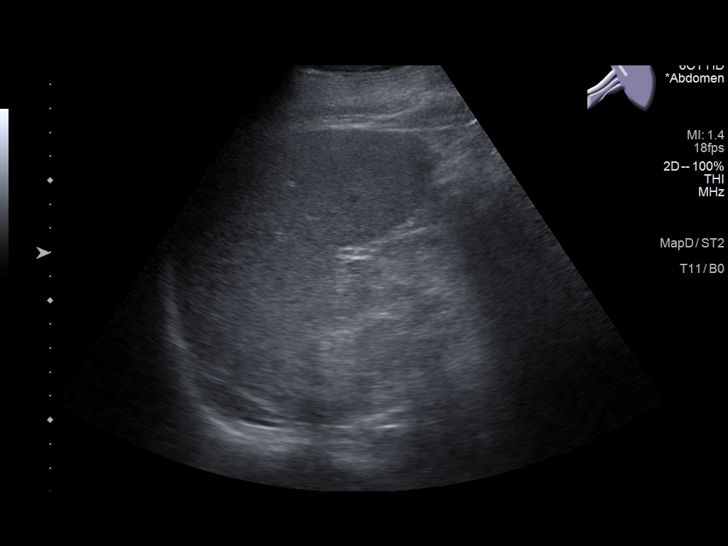
[im 65/98]
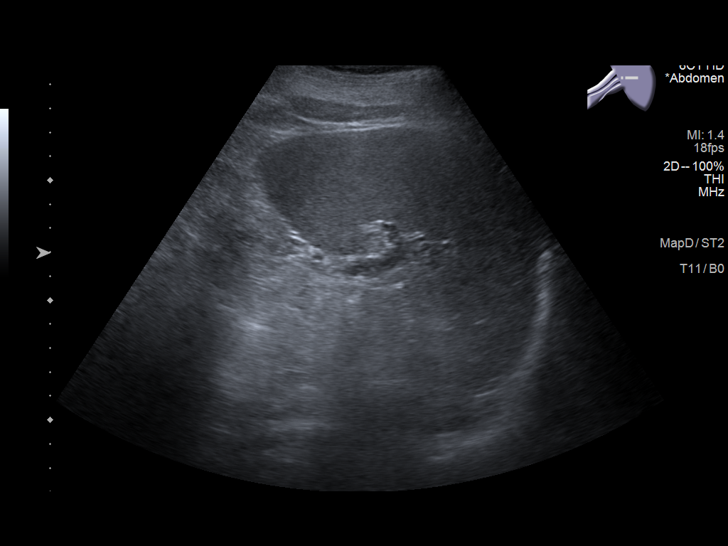
[im 73/98]
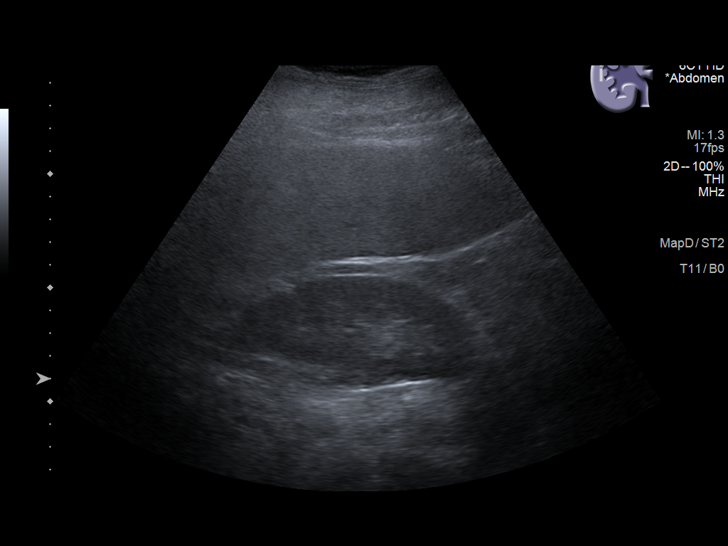
[im 81/98]
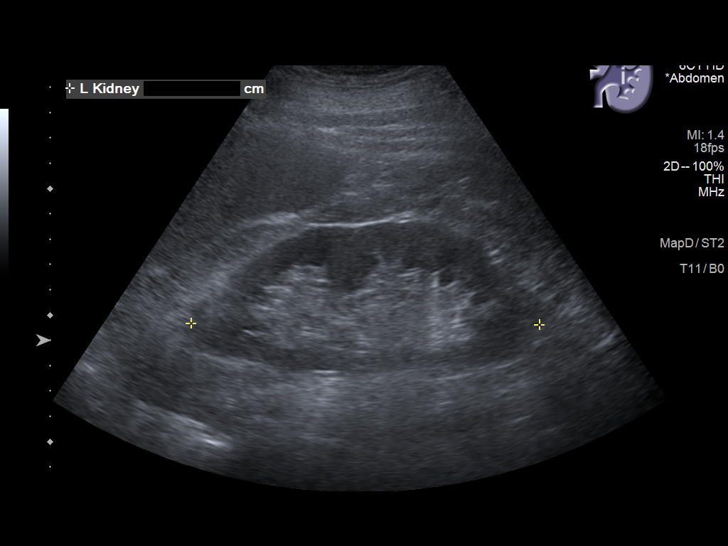
[im 89/98]
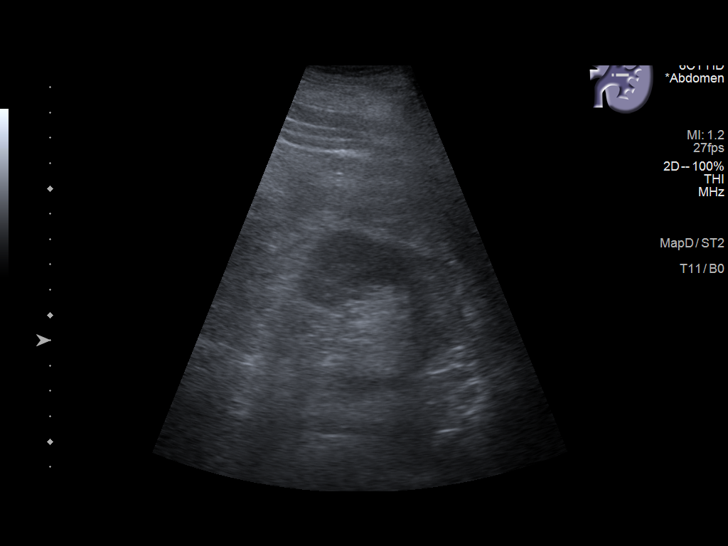
[im 98/98]
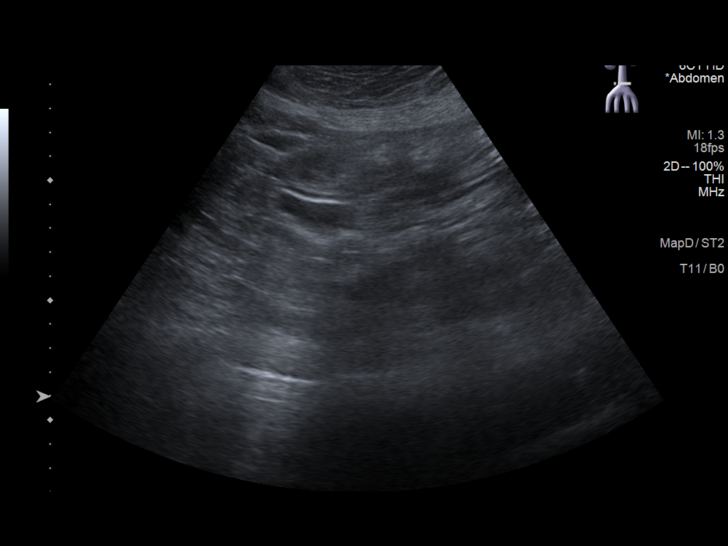

[14 of 25 positions shown; findings below may reference images not displayed]

FINDINGS: Gallbladder: No gallstones or wall thickening visualized. No
sonographic Murphy sign noted by sonographer.

Common bile duct: Diameter: 3.5 mm

Liver: Diffusely echogenic with focal hypoechoic region near
gallbladder fossa. Portal vein is patent on color Doppler imaging
with normal direction of blood flow towards the liver.

IVC: No abnormality visualized.

Pancreas: Visualized portion unremarkable.

Spleen: Slightly enlarged with volume of 594 mL.

Right Kidney: Length: 12 cm. Echogenicity within normal limits. No
mass or hydronephrosis visualized.

Left Kidney: Length: 13.8 cm. Echogenicity within normal limits. No
mass or hydronephrosis visualized.

Abdominal aorta: No aneurysm visualized.

Other findings: None.
IMPRESSION: 1. Diffusely echogenic liver consistent with hepatic steatosis with
probable fat sparing near the gallbladder fossa.
2. The spleen appears slightly enlarged.

## 2022-10-20 ENCOUNTER — Encounter: Payer: Self-pay | Admitting: Nurse Practitioner

## 2022-10-20 ENCOUNTER — Ambulatory Visit (INDEPENDENT_AMBULATORY_CARE_PROVIDER_SITE_OTHER): Payer: 59 | Admitting: Nurse Practitioner

## 2022-10-20 VITALS — BP 132/87 | HR 75 | Temp 97.9°F | Ht 70.0 in | Wt 257.5 lb

## 2022-10-20 DIAGNOSIS — E538 Deficiency of other specified B group vitamins: Secondary | ICD-10-CM | POA: Diagnosis not present

## 2022-10-20 DIAGNOSIS — R7989 Other specified abnormal findings of blood chemistry: Secondary | ICD-10-CM | POA: Diagnosis not present

## 2022-10-20 DIAGNOSIS — E7800 Pure hypercholesterolemia, unspecified: Secondary | ICD-10-CM | POA: Diagnosis not present

## 2022-10-20 DIAGNOSIS — R030 Elevated blood-pressure reading, without diagnosis of hypertension: Secondary | ICD-10-CM

## 2022-10-20 DIAGNOSIS — R29898 Other symptoms and signs involving the musculoskeletal system: Secondary | ICD-10-CM

## 2022-10-20 NOTE — Assessment & Plan Note (Signed)
Chronic.  Controlled.  Continue with current medication regimen.  Labs ordered today.  Return to clinic in 6 months for reevaluation.  Call sooner if concerns arise.  ? ?

## 2022-10-20 NOTE — Assessment & Plan Note (Signed)
Patient has followed up with Neurology and was cleared to return for light duty on 10/05/22 and full duty on 10/12/22.  Letter written for patient to return to work for full duty.

## 2022-10-20 NOTE — Progress Notes (Signed)
BP 132/87   Pulse 75   Temp 97.9 F (36.6 C) (Oral)   Ht 5\' 10"  (1.778 m)   Wt 257 lb 8 oz (116.8 kg)   SpO2 97%   BMI 36.95 kg/m    Subjective:    Patient ID: Cole Lopez, male    DOB: 03-Nov-1985, 37 y.o.   MRN: [id number]  HPI: Cole Lopez is a 37 y.o. male  Chief Complaint  Patient presents with   Hypertension   Hyperlipidemia   work note   History of elevation in LFTs on past labs with imaging done noting hepatic steatosis and spleen slightly enlarged.  He endorses alcohol use -- currently between his wife and him drink about a 5th of brown liquor, this is a cut back from previous -- used to drink 1/2 gallon between his wife and him.  HYPERTENSION without Chronic Kidney Disease Hypertension status: controlled  Satisfied with current treatment? yes Duration of hypertension: years BP monitoring frequency:  not checking BP range:  BP medication side effects:  no Medication compliance: excellent compliance Previous BP meds:none Aspirin: no Recurrent headaches: no Visual changes: no Palpitations: no Dyspnea: no Chest pain: no Lower extremity edema: no Dizzy/lightheaded: no  Relevant past medical, surgical, family and social history reviewed and updated as indicated. Interim medical history since our last visit reviewed. Allergies and medications reviewed and updated.  Review of Systems  Eyes:  Negative for visual disturbance.  Respiratory:  Negative for shortness of breath.   Cardiovascular:  Negative for chest pain and leg swelling.  Neurological:  Negative for light-headedness and headaches.    Per HPI unless specifically indicated above     Objective:    BP 132/87   Pulse 75   Temp 97.9 F (36.6 C) (Oral)   Ht 5\' 10"  (1.778 m)   Wt 257 lb 8 oz (116.8 kg)   SpO2 97%   BMI 36.95 kg/m   Wt Readings from Last 3 Encounters:  10/20/22 257 lb 8 oz (116.8 kg)  08/18/22 244 lb 9.6 oz (110.9 kg)  08/08/22 254 lb (115.2 kg)     Physical Exam Vitals and nursing note reviewed.  Constitutional:      General: He is not in acute distress.    Appearance: Normal appearance. He is obese. He is not ill-appearing, toxic-appearing or diaphoretic.  HENT:     Head: Normocephalic.     Right Ear: External ear normal.     Left Ear: External ear normal.     Nose: Nose normal. No congestion or rhinorrhea.     Mouth/Throat:     Mouth: Mucous membranes are moist.  Eyes:     General:        Right eye: No discharge.        Left eye: No discharge.     Extraocular Movements: Extraocular movements intact.     Conjunctiva/sclera: Conjunctivae normal.     Pupils: Pupils are equal, round, and reactive to light.  Cardiovascular:     Rate and Rhythm: Normal rate and regular rhythm.     Heart sounds: No murmur heard. Pulmonary:     Effort: Pulmonary effort is normal. No respiratory distress.     Breath sounds: Normal breath sounds. No wheezing, rhonchi or rales.  Abdominal:     General: Abdomen is flat. Bowel sounds are normal.  Musculoskeletal:     Cervical back: Normal range of motion and neck supple.  Skin:    General: Skin is warm  and dry.     Capillary Refill: Capillary refill takes less than 2 seconds.  Neurological:     General: No focal deficit present.     Mental Status: He is alert and oriented to person, place, and time.  Psychiatric:        Mood and Affect: Mood normal.        Behavior: Behavior normal.        Thought Content: Thought content normal.        Judgment: Judgment normal.     Results for orders placed or performed in visit on 08/18/22  CBC with Differential/Platelet  Result Value Ref Range   WBC 9.6 3.4 - 10.8 x10E3/uL   RBC 4.21 4.14 - 5.80 x10E6/uL   Hemoglobin 15.3 13.0 - 17.7 g/dL   Hematocrit 84.642.5 96.237.5 - 51.0 %   MCV 101 (H) 79 - 97 fL   MCH 36.3 (H) 26.6 - 33.0 pg   MCHC 36.0 (H) 31.5 - 35.7 g/dL   RDW 95.213.0 84.111.6 - 32.415.4 %   Platelets 461 (H) 150 - 450 x10E3/uL   Neutrophils 68 Not  Estab. %   Lymphs 23 Not Estab. %   Monocytes 6 Not Estab. %   Eos 2 Not Estab. %   Basos 1 Not Estab. %   Neutrophils Absolute 6.5 1.4 - 7.0 x10E3/uL   Lymphocytes Absolute 2.2 0.7 - 3.1 x10E3/uL   Monocytes Absolute 0.6 0.1 - 0.9 x10E3/uL   EOS (ABSOLUTE) 0.2 0.0 - 0.4 x10E3/uL   Basophils Absolute 0.1 0.0 - 0.2 x10E3/uL   Immature Granulocytes 0 Not Estab. %   Immature Grans (Abs) 0.0 0.0 - 0.1 x10E3/uL  Comprehensive metabolic panel  Result Value Ref Range   Glucose 131 (H) 70 - 99 mg/dL   BUN 10 6 - 20 mg/dL   Creatinine, Ser 4.010.73 (L) 0.76 - 1.27 mg/dL   eGFR 027119 >25>59 DG/UYQ/0.34mL/min/1.73   BUN/Creatinine Ratio 14 9 - 20   Sodium 141 134 - 144 mmol/L   Potassium 4.7 3.5 - 5.2 mmol/L   Chloride 104 96 - 106 mmol/L   CO2 24 20 - 29 mmol/L   Calcium 10.2 8.7 - 10.2 mg/dL   Total Protein 7.4 6.0 - 8.5 g/dL   Albumin 4.2 4.1 - 5.1 g/dL   Globulin, Total 3.2 1.5 - 4.5 g/dL   Albumin/Globulin Ratio 1.3 1.2 - 2.2   Bilirubin Total 0.7 0.0 - 1.2 mg/dL   Alkaline Phosphatase 60 44 - 121 IU/L   AST 66 (H) 0 - 40 IU/L   ALT 58 (H) 0 - 44 IU/L  Magnesium  Result Value Ref Range   Magnesium 2.2 1.6 - 2.3 mg/dL  Phosphorus  Result Value Ref Range   Phosphorus 3.1 2.8 - 4.1 mg/dL      Assessment & Plan:   Problem List Items Addressed This Visit       Other   Prehypertension - Primary    Chronic.  Controlled.  Continue with current medication regimen.  Labs ordered today.  Return to clinic in 6 months for reevaluation.  Call sooner if concerns arise.        Relevant Orders   Comp Met (CMET)   Elevated low density lipoprotein (LDL) cholesterol level    Labs ordered at visit today.  Will make recommendations based on lab results.         Relevant Orders   Lipid Profile   Elevated LFTs    Labs ordered at visit today.  Will make recommendations based on lab results.        Relevant Orders   Hepatic function panel   Lower extremity weakness    Patient has followed up with  Neurology and was cleared to return for light duty on 10/05/22 and full duty on 10/12/22.  Letter written for patient to return to work for full duty.      B12 deficiency    Labs ordered at visit today.  Will make recommendations based on lab results.           Follow up plan: Return in about 6 months (around 04/21/2023) for Physical and Fasting labs.

## 2022-10-20 NOTE — Assessment & Plan Note (Signed)
Labs ordered at visit today.  Will make recommendations based on lab results.   

## 2022-10-21 LAB — LIPID PANEL
Chol/HDL Ratio: 6.9 ratio — ABNORMAL HIGH (ref 0.0–5.0)
Cholesterol, Total: 227 mg/dL — ABNORMAL HIGH (ref 100–199)
HDL: 33 mg/dL — ABNORMAL LOW (ref >39)
LDL Chol Calc (NIH): 162 mg/dL — ABNORMAL HIGH (ref 0–99)
Triglycerides: 174 mg/dL — ABNORMAL HIGH (ref 0–149)
VLDL Cholesterol Cal: 32 mg/dL (ref 5–40)

## 2022-10-21 LAB — COMPREHENSIVE METABOLIC PANEL
ALT: 22 IU/L (ref 0–44)
AST: 20 IU/L (ref 0–40)
Albumin/Globulin Ratio: 1.5 (ref 1.2–2.2)
Albumin: 4.3 g/dL (ref 4.1–5.1)
Alkaline Phosphatase: 53 IU/L (ref 44–121)
BUN/Creatinine Ratio: 18 (ref 9–20)
BUN: 13 mg/dL (ref 6–20)
Bilirubin Total: 0.4 mg/dL (ref 0.0–1.2)
CO2: 23 mmol/L (ref 20–29)
Calcium: 9.6 mg/dL (ref 8.7–10.2)
Chloride: 98 mmol/L (ref 96–106)
Creatinine, Ser: 0.73 mg/dL — ABNORMAL LOW (ref 0.76–1.27)
Globulin, Total: 2.8 g/dL (ref 1.5–4.5)
Glucose: 78 mg/dL (ref 70–99)
Potassium: 4 mmol/L (ref 3.5–5.2)
Sodium: 138 mmol/L (ref 134–144)
Total Protein: 7.1 g/dL (ref 6.0–8.5)
eGFR: 119 mL/min/{1.73_m2} (ref >59)

## 2022-10-21 LAB — HEPATIC FUNCTION PANEL: Bilirubin, Direct: 0.13 mg/dL (ref 0.00–0.40)

## 2022-10-21 MED ORDER — ROSUVASTATIN CALCIUM 5 MG PO TABS: 5.0000 mg | ORAL_TABLET | Freq: Every day | ORAL | 1 refills | Status: AC

## 2022-10-21 NOTE — Progress Notes (Signed)
Hi Cole Lopez. It was nice to meet you yesterday.  Your lab work looks good.  Your liver enzymes have improved.  However, your cholesterol is elevated.  I recommend starting a cholesterol medication to help bring those numbers down.  If you agree, I will send this in to the pharmacy.  No other concerns at this time. Continue with your current medication regimen.  Follow up as discussed.  Please let me know if you have any questions.

## 2022-10-21 NOTE — Progress Notes (Signed)
Medication sent to the pharmacy.

## 2022-10-21 NOTE — Addendum Note (Signed)
Addended by: Larae Grooms on: 10/21/2022 10:13 AM   Modules accepted: Orders

## 2023-04-21 ENCOUNTER — Encounter: Payer: 59 | Admitting: Family Medicine

## 2023-05-05 ENCOUNTER — Encounter: Payer: 59 | Admitting: Family Medicine

## 2023-05-29 ENCOUNTER — Encounter: Payer: 59 | Admitting: Family Medicine

## 2023-07-28 ENCOUNTER — Encounter: Payer: 59 | Admitting: Family Medicine

## 2023-09-28 ENCOUNTER — Encounter: Payer: Self-pay | Admitting: Family Medicine
